# Patient Record
Sex: Male | Born: 1970 | Race: White | Hispanic: No | Marital: Married | Smoking: Never smoker
Health system: Southern US, Academic
[De-identification: ages and names within clinical notes are randomized; demographics above are authoritative.]

---

## 2005-06-29 ENCOUNTER — Other Ambulatory Visit (HOSPITAL_COMMUNITY): Payer: Self-pay

## 2014-06-30 ENCOUNTER — Encounter (HOSPITAL_COMMUNITY): Payer: Self-pay

## 2014-06-30 NOTE — Ancillary Notes (Addendum)
[facility] Record for: Samuel Tapia, Samuel Tapia   Created: 06/29/2014 12:16:30 PM  MRN: [id number]  DOB: May 25, 1971  SSN: [id number]  Sex: Male  Height: 5 Feet 8 Inches - Weight: 189  Maiden Name:   Address:   [address]  CityStZip: [place], [place] [address]  E-Mail:   Day Phone: [phone] Night Phone:  Other Phone:   Phone comments:   Call Back time:   Authorized contact: WIFE-Kimberly Karrie Meres  Intake Date: 06/29/2014 12:16:30 PM  PCP: Reggy Eye, Ryan   RefMD: Self , Referral   Primary Insurance: Workers' Comp  Secondary Insurance:   Insurance Comments: WE NEED A SINGLE CASE AGREEMENT/ctAuthorization: [facility] Initial Evaluation - within 60 days of July 02, 2014 filed copy in [place] folder/cc     -------Referral Assignment------  Where was the original referral directed?: Spine Center  Was a specific reviewer requested?: Unassigned Referral  How was the reviewer selected?: Rotation  Who requested the specific reviewer?:   How did you hear of our spine program?:   Is this a second opinion?:      -------Merchandiser, retail----------  Favorite Color:   City of Birth:      ---------- 1st Review ----------     Review Date: 07/23/2014  Review Completed by: John Guinea-Bissau  Impression: Herniated Lumbar Disc  Disposition: Appointment with Me, Other Treatment or Testing  Appt w/ Colleague:   Colleague Name:   Appt How Soon:   Pre Treatment Type: X-Rays; ESI  Pre Treatment Type Details: X-Ray Type: A/P Lateral; Lumbar; Spot ESI Level: L5-S1 ESI Side: midline ESI Number of Injections: 1 ESI Follow-Up: Reviewing Provider   Pre Treatment Other Test:   Appt Type: First Available Appointment  Instructions: LBP and right leg numbness.MRI from 2014 with large L5-S1 right sided HNP.  Updated MRI now with small residual disc material.  Sounds like a S1 radiculopathy.Get a L5-S1 ESI and I can see after injection.  Needs lumbar x-rays ap, lat, spot when seen.     ---------- Symptoms ----------     Chief  complaint: low back pain with back spasms --- more concerned with right leg numbness and  back weaknessno/hip/buttuck/leg/groin pain  Diagnosis from Other MD:      Symptoms: Pain,Numbness and/or tingling,Muscular weakness  Other Symptom Description:   Pain Location: Low back  Other Pain Location Description:   Where is your pain the worst?:   Pain Type: Sharp/Stabbing,Dull/Aching,Intermittent  Pain Rating: 5  Does the pain radiate to other parts of your body? No   Radiate Where:   Other Description:   Does it radiate to the fingers?   Does it radiate below the elbow?   Which specific part of your arm?   Which fingers?   Which part of your arm does pain go to?   How does it radiate to the arm?   Does it radiate to the toes?   Does it radiate below the knee?   Which specific part of your leg?   Which toes?   Which part of your leg does pain go to?   How does it radiate to the leg?   Additional pain information      Location of Numbness/tingling: Leg,Toes  Other Description:  Does numbness/tingling radiate to other parts of your body?:  Where does the numbess/tingling radiate?:  Other Description:  Does the numbness/tingling radiate below your elbow?:  Which specific part of your arm?:  Which fingers:  Which pat of your arm  does the numbness/tingling go to?:  How does the numbness/tingling radiate to your arm?:  Does the numbness/tingling radiate below your knee?:  Which specific part of your leg?:  Which toes:  Which part of your leg does the numbness/tingling go to?:  How does the numbness/tingling radiate to your leg?:  Additional Numbness/tingling information: Tingling and numbness in right calf to ball of right foot and all right toes  Other Description:      Location of Weakness: Low back  Other Description:   Additional Weakness Information:      The symptoms have been present for: more than 1 year  The symptoms began:   Was there a specific event that caused your symptoms?: As a result of an injury at  work  Additional Narrative Description: 02/2013   --- re-injured 6/15  Are you able to perform your daily activities with these symptoms?:   Since what date have you been unable to perform your daily routine?:   The symptoms improve when you: Walk  Other activities that improve your symptoms:   The symptoms worsen when you: Stand,Sit,Activity  Other activities that worsen your symptoms:      ---------- Work History ----------     Are you able to work?: Yes  Reasons for not working:   Other Reason for not working:   Do you have Work Restrictions:   If applicable, maximum lifting restriction:   How long have you been unable to work:   Have you ever filed a W/C claim related to a neck or back injury?: yes  Occupation: Physical labor  Other Occupation Information: Engineer, civil (consulting)     ---------- Bowel/Bladder/Incontinence Issues ----------     Since the onset of symptoms, have you experienced any new problems urinating or having bowel movements?: No  Description:   Other Description:   How long have you had these bowel/bladder problems?:      ---------- Allergies ----------     Do you have any medication allergies? No  Allergies:   Other Details:   Allergic to Latex?: No  Allergic to intravenous contrast dyes?: No  Allergic to steroids?: No     ---------- Treatment/Testing ----------      Taking prescription medication for this problem?: Yes  Are you using any now?: Yes  Medication: Robaxin; MD Name: Dr. Renold Don; Date Prescribed: 2014; Dosage: ; Times/Day: 2- 3 x day  Medication: Ibuprofen; MD Name: Dr. Renold Don; Date Prescribed: 2014; Dosage: ; Times/Day: 2 - 3 x day  Have you received a Medrol dose pack for this problem?: No  When did you last take the dosepack?:   Were your symptoms improved?:   Would you describe your relief as:   How long did you experience that amount of relief?:   Other Medrol dosepack information:      --------------- PT ---------------     PT: Yes  When Received: 6/15  [facility] Received[facility]  Other where received information:   Visits: 3 x weeks for 6 months  Types:   Other Types: core streghtening  Improved: Yes     If improved, describe level of relief: Large  If improved, how long did you experience relief: Long term  Other Physical Therapy Treatment Information:      ---------- Chiropractic Services ----------     Chiro: Yes  When: 2014  Who: Bland Span  Visits: 5 x day 6 weeks  Types: Manipulation,Modalities - Ultrasound/ E-stim  Other Types:   Were Symptoms Improved: Yes  If improved, describe level of relief: Small  If improved, how long did you experience relief: Short term  Other Chiropractic Treatment Information:      --------------- ESI ---------------     ESI: No  When:   Who:   Visits:   Types:   Improved:   If improved, describe level of relief:   If improved, how long did you experience relief:   Other Injection Treatment Information:      ---------- Diagnostic Tests ----------     Plain spine X-rays [id number] at Review Where:   Area(s) Scanned: Lumbar  MRI scan On:03-17-13 Where: [facility]  Area Scanned: Lumbar  MRI Lumbar On:07/08/14 Where: [facility]   Do you have any of the following in case an MRI is ordered?: None  Other MRI factors:      ---------- Past Medical History ----------     What other doctors/providers have treated you for these spine issues? Dr. Noreene Filbert Specialty: Neurosurgery Date: 2014    Ever diagnosed with Spine deformity?:   Prior neck or back surgery (1)?: No  When:   Who:   Area of neck/spine operated on:   Level:   Were symptoms improved?:   If improved, describe level of relief:  If improved, how long did you experience relief?:   Prior neck or back surgery (2)?:   When:   Who:   Area of neck/spine operated on:   Level:   Were symptoms improved?:   If improved, describe level of relief:  If improved, how long did you experience relief?:   Prior neck or back surgery (3)?:   When:   Who:   Area of neck/spine operated on:    Level:   Were symptoms improved?:   If improved, describe level of relief:  If improved, how long did you experience relief?:   Do you have any of the following assistive devices? None  How long have you required these assistive devices?   Currently being treated for any other medical condition?: Yes  Conditions: Hypercholesterolemia  Other Conditions:   Type of neurologic disorder:   Cancer Type:   Other Treatment/Medication: Pravastatin  What blood thinners are you currently taking?: None  Which physicians are treating you for your other medical conditions?: Dr. Renold Don  Do you smoke?: No  Are you pre-menopausal or post-menopausal?:   If recommended, are you willing to consider surgery?:   What is your goal in seeking treatment?:   Other pertinent information/ general comments/ goals for treatment: skin cancer-2014no other studies     ---------- Care Coordinator Information ----------     Care Coordinator: RS  Activity Log: 07/23/2014 10:33:27 AM Misty Stanley Herod]: Phone call to patient. Reviewed patient's medical history.  Patient states no change in symptoms.  Discussed MD initial impression and recommendations of ESI L5-S1 midline single injection and first available with Dr. Guinea-Bissau where he will get x-rays.  Provided education on LBP and right leg numbness. MRI from 2014 with large L5-S1 right sided HNP.  Updated MRI now with small residual disc material. Sounds like a S1 radiculopathy. Patient chose to accept recommendations. Instructed to follow-up with PCP/referring MD for therapy script due to insurance restrictions.  Instructed Referral Specialist will call to schedule appointment/tests. Explained that a letter will be faxed to the PCP/referring physician regarding this conversation. Patient verbalized understanding. Marcelina Morel, RN9/21/2015 1:49:37 PM [Lori Mueller]: Received call from patient stating "My cousin developed arachnoiditis from an epidural shot and I'm not comfortable getting  the injection just  yet, I'm afraid it might be congenital".  Offered first available with Dr. Guinea-Bissau and he agreed. LMueller RN CRRN     Letter/Test Coordinator: Letters  Letter/Test Coordinator Log: 07/23/2014 11:35:40 AM Elnita Maxwell Chidester]: Faxed treatment letter patient summary and requisition to Dr. Mattie Marlin. CChidester 07/27/2014 8:47:30 AM Rosey Bath Benson]: Signed requisition received. TBenson 07/27/2014 11:17:46 AM [Crystal Tephabock]: I sent an email to Nicholaus Corolla to check insurance. Ctephabock 07/27/2014 1:41:28 PM [Crystal Tephabock]: WE NEED A SINGLE CASE AGREEMENT. I called and left a voicemail for the case worker to call us to get the injection and appointment set with the singlecase agreement. I called and updated the pt. Ctephabock 07/27/2014 1:57:44 PM [Crystal Tephabock]: Pt doesn't want an injection but still needs the singlecase agreement to see Dr. Guinea-Bissau. Ctephabock 08/03/2014 10:08:31 AM [Crystal Tephabock]: Pt called in and I told him we are still waiting for the case worker to call in to do the single case agreement. He will call his case worker now. Ctephabock 08/03/2014 2:41:33 PM Rosey Bath Benson]: Patient called and they are still working on single case agreement per his case worker. TBenson 08/03/2014 4:26:00 PM [Leslie Crossley]:  Authorization received for spine center appointment.  Carroll Kinds RN 08/04/2014 10:13:54 AM Rosey Bath Benson]: Called and spoke to patient and explained that the authorization we received is not a single case agreement and he said he did talk to his case worker yesterday and they are working on single case agreement.  TBenson 08/05/2014 9:10:38 AM Verlon Au Crossley]: Authorization for Initial evaluation received from [facility].  Carroll Kinds RN 08/05/2014 10:41:50 AM Aggie Cosier Tephabock]: Authorization received is not a single case agreement. Ctephabock 08/05/2014 12:44:06 PM [Crystal Tephabock]: Pt's employer called to get information about the singlecase agreement. I explained  that I couldn't give his employer any information and the I would call the pt and speak with him. I called and left a voicemail for the pt to call back. Ctephabock 08/05/2014 1:45:59 PM [Crystal Tephabock]: Pt called and he was wanting to know when we received his authorization letter. I told him and explained again that we haven't received the singlecase agreement. I explained what it is and he is working with his HR person at the work to get this taken care of. I also gave him [facility] name and phone number. Ctephabock 08/12/2014 10:39:03 AM [Crystal Tephabock]: Pt called and said his case worker told him to call us that he had the single case agreement set up. I called and left a voicemail for Delight Stare to call back to check on this. Ctephabock 08/13/2014 11:24:34 AM [Crystal Tephabock]: Singlecase agreement has been received per Tammy Boyce---effective dates 08/06/14-02/04/15. I called and left a voicemail for the pt to call back to schedule. Ctephabock 08/13/2014 1:13:28 PM Rosey Bath Benson]: Patient returned call and is scheduled to see Dr. Guinea-Bissau on 10/13/14 at 12:15pm. TBenson     Intake Specialist Comments: 06/30/2014 12:18:46 PM Elnita Maxwell Chidester]: Spoke with patient.  He is eating his lunch. Ask if we could call him in about one hour. Mailed postcard also. CChidester.     Clinic Staff Comments:      Last Edited by: Knute Neu on 08/13/2014 1:15:35 PM  Last Review by: John Guinea-Bissau on 07/23/2014

## 2014-10-13 ENCOUNTER — Other Ambulatory Visit (INDEPENDENT_AMBULATORY_CARE_PROVIDER_SITE_OTHER): Payer: Self-pay | Admitting: Orthopaedic Surgery of the Spine

## 2014-10-13 ENCOUNTER — Encounter (INDEPENDENT_AMBULATORY_CARE_PROVIDER_SITE_OTHER): Payer: Self-pay | Admitting: Orthopaedic Surgery of the Spine

## 2014-10-13 ENCOUNTER — Ambulatory Visit: Payer: Worker's Compensation | Attending: Orthopaedic Surgery of the Spine | Admitting: Orthopaedic Surgery of the Spine

## 2014-10-13 ENCOUNTER — Ambulatory Visit (HOSPITAL_BASED_OUTPATIENT_CLINIC_OR_DEPARTMENT_OTHER): Payer: Worker's Compensation

## 2014-10-13 VITALS — BP 141/87 | HR 83 | Temp 98.1°F | Ht 69.6 in | Wt 197.3 lb

## 2014-10-13 DIAGNOSIS — R29890 Loss of height: Secondary | ICD-10-CM

## 2014-10-13 DIAGNOSIS — M4316 Spondylolisthesis, lumbar region: Secondary | ICD-10-CM

## 2014-10-13 DIAGNOSIS — M549 Dorsalgia, unspecified: Secondary | ICD-10-CM

## 2014-10-13 DIAGNOSIS — M5117 Intervertebral disc disorders with radiculopathy, lumbosacral region: Secondary | ICD-10-CM | POA: Insufficient documentation

## 2014-10-13 DIAGNOSIS — M545 Low back pain, unspecified: Secondary | ICD-10-CM

## 2014-10-14 NOTE — H&P (Addendum)
[facility]                              DEPARTMENT OF [place], [place] [address]                                PATIENT NAME: Samuel Tapia, Samuel Tapia  HOSPITAL UEAVWU:[id number]  DATE OF SERVICE:10/13/2014  DATE OF BIRTH: 1971/04/21    HISTORY AND PHYSICAL    CHIEF COMPLAINT:  Lower back pain and bilateral lower extremity radiculopathy symptoms.    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  He developed back pain soon after finishing his shift.  He was diagnosed with an L5-S1 broad-based HNP and completed a 13-month course of physical therapy and returned to work after about 6 months.  He did describe right lower extremity radicular symptoms at the time of this injury.  He was in his normal post-injury status up until June 2015, when he completed a 2-hour flight in which he says that he felt quite cramped.  He denies any specific injury but states that the following morning when he awakened from his chest he had significant lower back pain and radicular symptoms in his left lower extremity that started the side of his hip and proceeded distally to his thigh.  He describes the sensation as a burning feeling.  It never progresses past the level of his knee.  He also endorses having a 1-cm leg length discrepancy with his right being shorter than his left.  He states that he has essentially L5 radiculopathy symptoms that go from his buttock to his leg and into his great toe.  This is all numbness, and there is no pain.  The patient also feels that if his right calf is weaker than his left.  Sitting, standing and motion tend to aggravate his symptoms.  He has tried physical therapy,  ibuprofen and Robaxin which he has found helpful.  He has also visited a chiropractor on several occasions which has also been somewhat helpful.  He denies any red flag symptoms to include bowel or bladder incontinence, saddle anesthesia or any myelopathic symptoms.    REVIEW OF SYSTEMS:  A comprehensive review of systems was performed at today's visit and notable for numbness, tingling and balance problems in the right lower extremity, as well as seasonal allergies.    PAST MEDICAL HISTORY:  Significant for skin cancer, unspecified, treatment complete, yearly checks, as well as hyperlipidemia and Bell palsy.    PAST SURGICAL HISTORY:  Significant for multiple lipoma removals in August 2015 in [place], [place].    MEDICATIONS:  1.  Ibuprofen 800 mg b.i.d. p.r.n.  2.  Robaxin 750 mg daily p.r.n.  3.  Pravastatin 40 mg nightly.  4.  Claritin 1 tab nightly p.r.n.    ALLERGIES:  NKDA.    SOCIAL HISTORY:  The patient is employed as a Soil scientist, who currently works Marine scientist.  His last non-light duty shift was on May 03, 2014.  He enjoys hunting, movies and skiing prior to this injury.  He does not smoke, chew or rub tobacco.  He occasionally drinks alcoholic beverages.  This injury is being covered by workers' compensation.  There are no disability claims or social security benefits pending.  There are no legal transactions in progress.    FAMILY HISTORY:  Significant for father with a back injury related to work and the father with high blood pressure, skin cancer, seizures and high cholesterol, and a mother with hypertension and multiple sclerosis.    PHYSICAL EXAMINATION:  Vitals at today's visit:  BP 141/87, pulse 83, temperature 98.1 degrees Fahrenheit, weight 89.5 kg, height 5 feet 9-1/2 inches tall.    General:  The patient is in no acute distress.  He is accompanied by his wife in the exam room.  Psychiatric:  Mood and affect congruent with clinical situation.  Neuro:  Facies symmetric.  Skin:  No  lesions, rashes or excoriations noted.  HEENT:  Head is atraumatic, normocephalic.  Respiratory:  Nonlabored breathing.  Cardiovascular:  Regular rate.    Abdomen:  Nondistended.    Spine:  Observation of the patient's gait reveals a nonantalgic limp.  He is able to walk on toes, heels and tandem with no balance or strength issues.  The patient is able to perform at least 10 heel raises on each lower extremity without any issue.  The patient does not have any tenderness to palpation over his midline lumbar spine.  He has 5/5 strength to his bilateral upper and lower extremities.  Sensation is intact to light touch throughout the C5 through T1 and L3 through S1 distributions.  He does endorse paresthesias to the left lateral thigh from approximately the buttock level down to the knee.  His reflexes are 1/4 and symmetric to the bilateral upper and lower extremities.  Hoffmann is negative.  There is no clonus.  Babinski is downgoing.  Straight leg raise is negative bilaterally.    IMAGING:  X-rays of the patient's lumbar spine were taken and reviewed at today's visit and notable for slightly decreased disk height at the L5-S1 level.  An MRI of the patient's lumbar spine from May 2014 was reviewed and notable for a broad-based disk bulge at the L5-S1 level.  A new MRI from July 08, 2014, was also reviewed on [facility] and notable for significant resolution of the broad-based disk bulge at L5-S1 with the right foramen slightly narrower than the left.  There is also some mild bilateral foraminal stenosis at the L4-L5 level.    ASSESSMENT:  Lumbar back pain, resolving; L5-S1 herniated nucleus pulposus (HNP) and bilateral lower extremity radicular symptoms.    PLAN:  At this point in time, the patient's complaints seem to be mostly stemming from his back pain rather than his leg issues.  We discussed with him the fact that back pain is much more difficult to treat than leg pain.  In fact, we would be hesitant to even  offer an epidural injection at this time secondary to the minimal amount of leg discomfort the patient is experiencing.  While we do not necessarily advocate for surgery at this time, surgical interventions were discussed with the patient in full detail.  Specifically, we discussed an L5-S1 anterior lumbar interbody fusion (ALIF) with posterior percutaneous screws.  The risks and benefits of the procedure were detailed out for the patient for his consideration.  We would continue  to recommend activities as tolerated, physical therapy and conservative treatment.  The patient can continue seeing a chiropractor if he chooses.  It was basically up to him to decide how much of his quality of life is impacted by this injury.  He is okay to return to work when he sees fit.  We would be happy to see him again on a p.r.n. basis.  If he has any questions or concerns, he is welcome to contact our office.      Juleen ChinaValerie Nicole Brice, MD  Resident  [facility]    Samoria Fedorko Guinea-BissauFrance, MD  Chief, Section of Spinal Surgery  [facility]    [id number]; D: 10/13/2014 15:27:19; T: 10/14/2014 09:46:18           I personally saw and examined this patient, reviewed their studies, the resident  or PA note, and dicussed the findings and treatment options with the patient and resident or PA.      Jaelen Gellerman Guinea-BissauFrance, MD 10/14/2014, 16:59

## 2021-03-17 IMAGING — MR MRI JOINT LOWER EXTREMITY WITHOUT CONTRAST LT
4 of 6 series · 23 of 40 positions shown · IV contrast (gadolinium)
Comparison: Radiographs dated 12/11/2019.

EXAM:  MRI JOINT LOWER EXTREMITY WITHOUT CONTRAST LT
INDICATION: Left knee pain, no recent injury.
TECHNIQUE: Multiplanar multisequential MRI of the left knee joint was performed without gadolinium contrast.

[Series 5: PD fat-sat · axial · left · 4.0mm · 0.37mm/px · z∈[-111,+20]mm · 7 of 30 slices shown (1 of 4)]
[im 1/30]
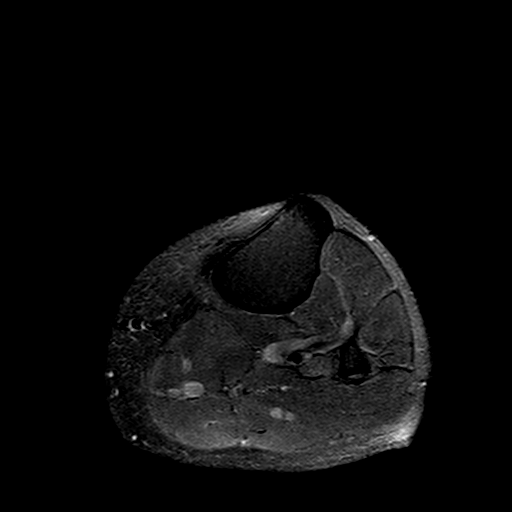
[im 5/30]
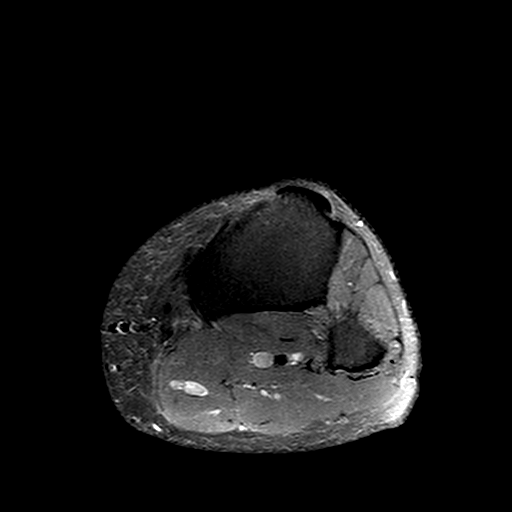
[im 10/30]
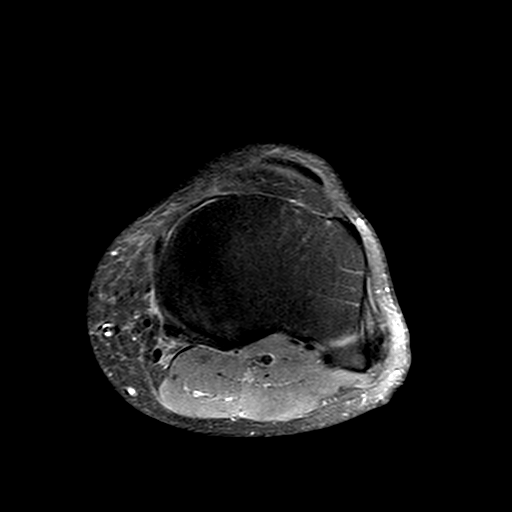
[im 15/30]
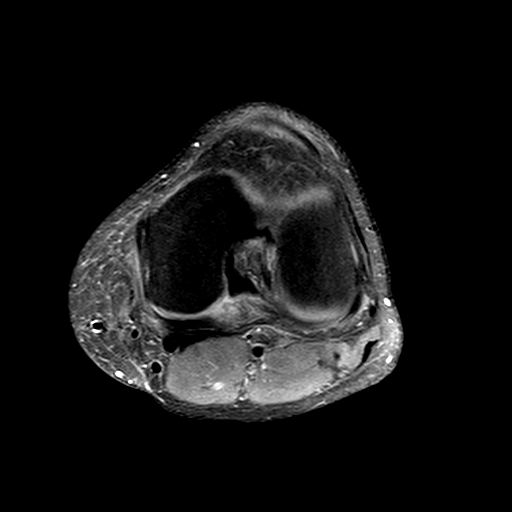
[im 20/30]
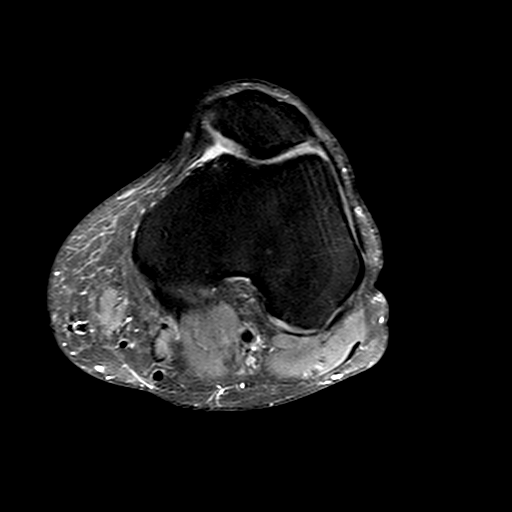
[im 25/30]
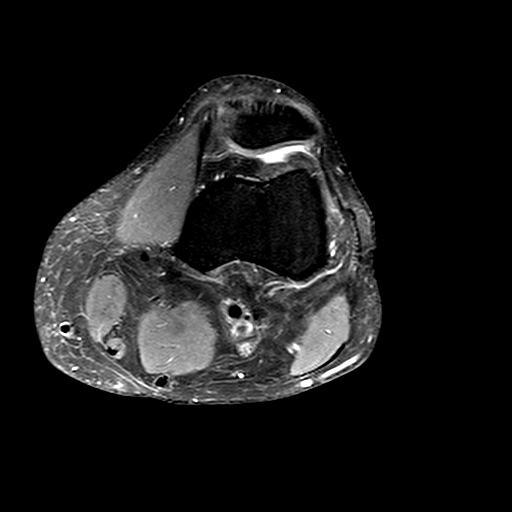
[im 30/30]
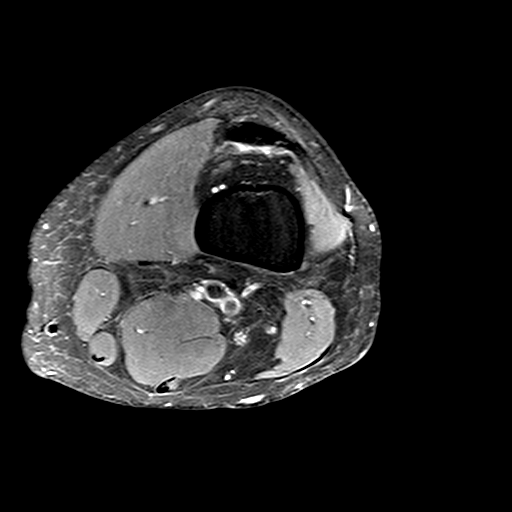

[Series 6: PD fat-sat · sagittal · left · 3.0mm · 0.29mm/px · 7 of 30 slices shown (2 of 4)]
[im 1/30]
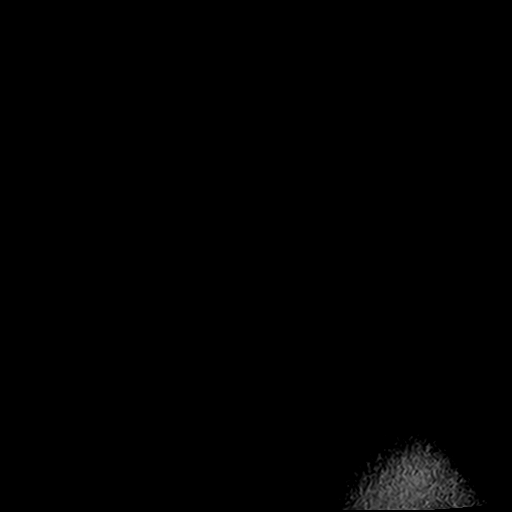
[im 5/30]
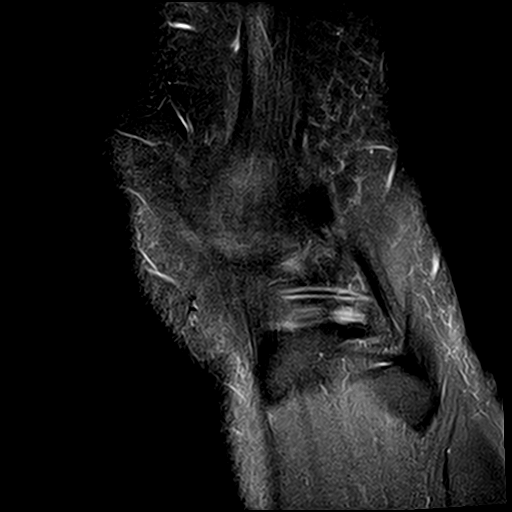
[im 10/30]
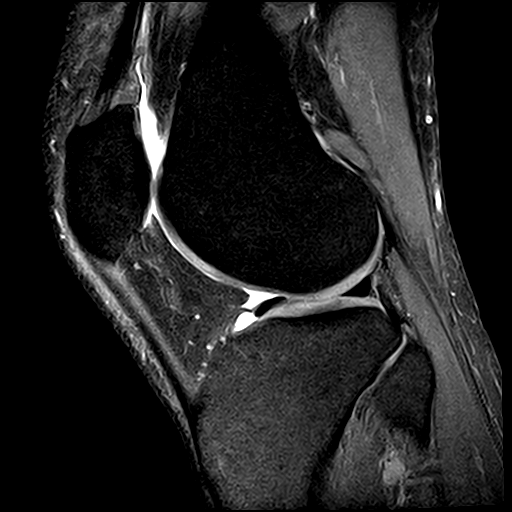
[im 15/30]
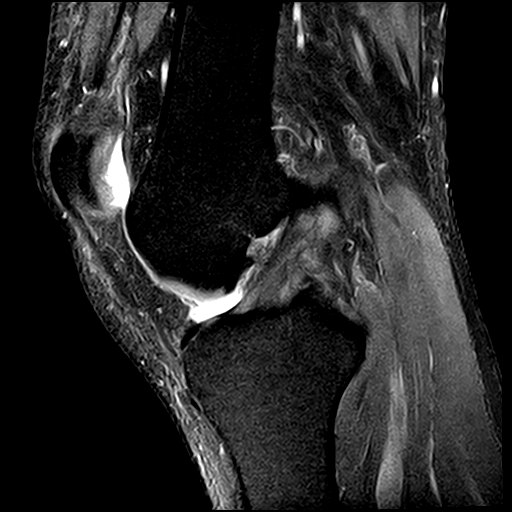
[im 20/30]
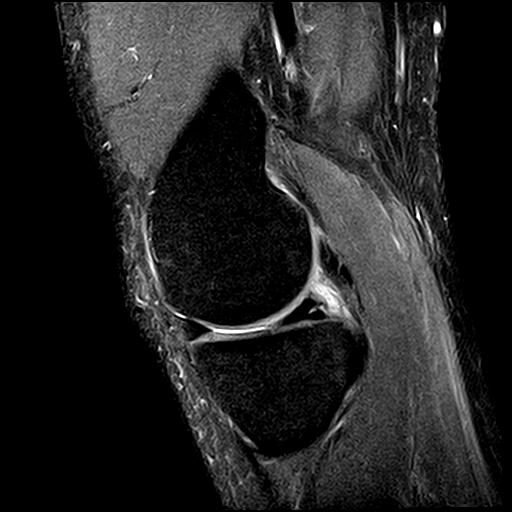
[im 25/30]
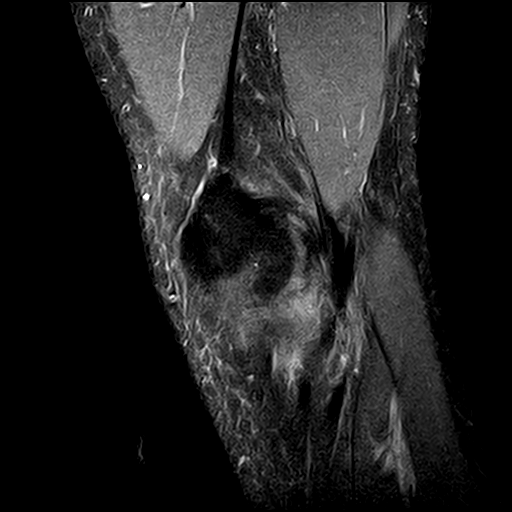
[im 30/30]
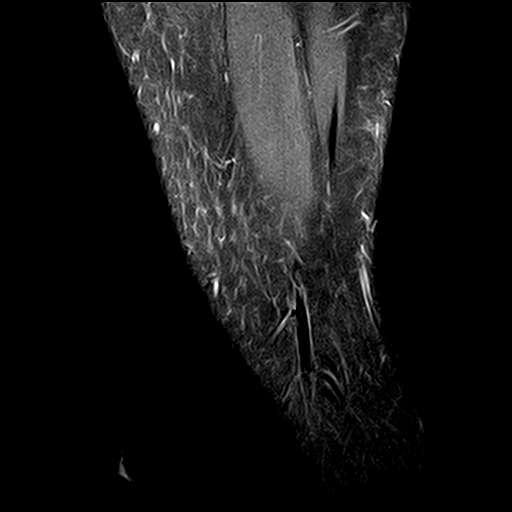

[Series 12: PD fat-sat · coronal · left · 3.0mm · 0.33mm/px · 6 of 27 slices shown (3 of 4)]
[im 1/27]
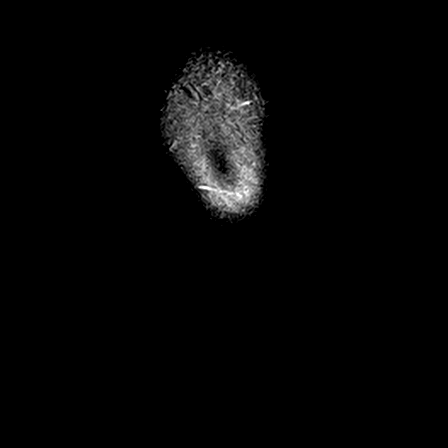
[im 6/27]
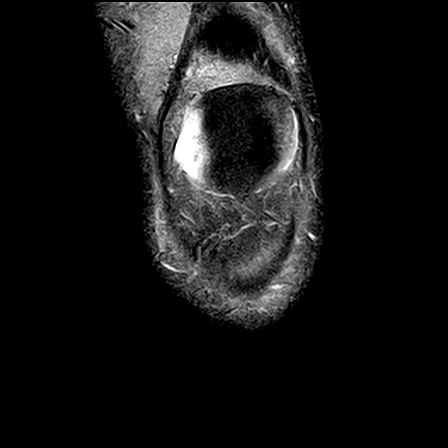
[im 11/27]
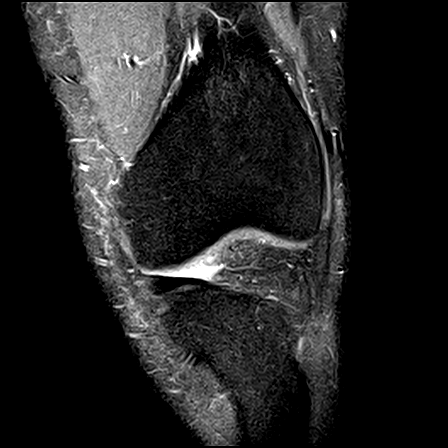
[im 16/27]
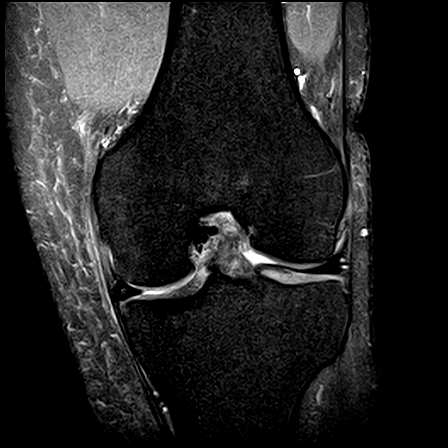
[im 21/27]
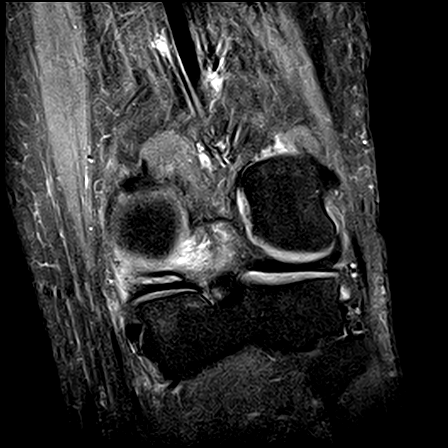
[im 27/27]
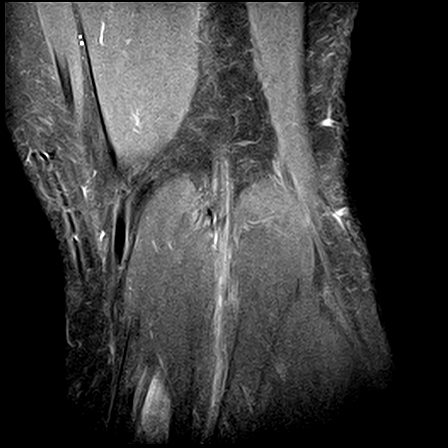

[Series 14: PD fat-sat · axial · left · 4.0mm · 0.59mm/px · z∈[-93,-3]mm · 3 of 30 slices shown (4 of 4)]
[im 5/30]
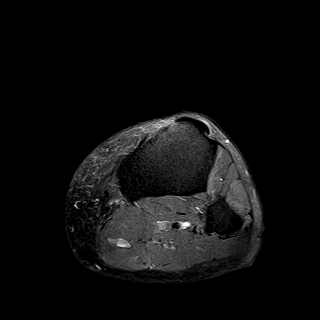
[im 15/30]
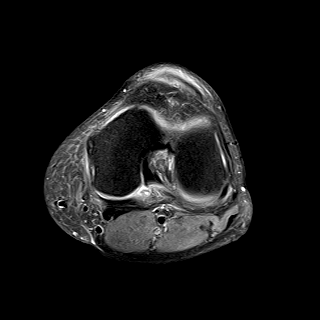
[im 25/30]
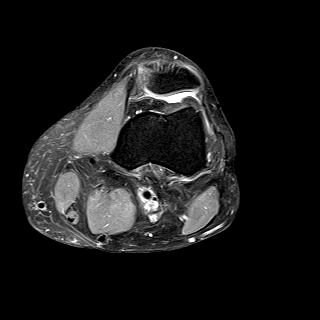

[23 of 40 positions shown; findings below may reference images not displayed]

FINDINGS: There is a complex tear involving the body and posterior horn of the medial meniscus. Lateral meniscus, cruciate and collateral ligaments are intact, within normal limits in morphology and signal intensity. Hyaline cartilage of the tibiofemoral and patellofemoral articulations is well maintained. Extensor mechanism is intact. Capsular attachments appear unremarkable. Bone marrow signal intensity is normal. There is no suprapatellar effusion. There is no Baker's cyst.
IMPRESSION: Complex tear involving the body and posterior horn of the medial meniscus.

## 2022-03-21 ENCOUNTER — Other Ambulatory Visit (HOSPITAL_COMMUNITY): Payer: Self-pay | Admitting: FAMILY PRACTICE

## 2022-03-21 DIAGNOSIS — R1310 Dysphagia, unspecified: Secondary | ICD-10-CM

## 2022-04-28 ENCOUNTER — Inpatient Hospital Stay
Admission: RE | Admit: 2022-04-28 | Discharge: 2022-04-28 | Disposition: A | Discharge status: Home / Self Care | Source: Ambulatory Visit | Attending: FAMILY PRACTICE | Admitting: FAMILY PRACTICE

## 2022-04-28 ENCOUNTER — Other Ambulatory Visit: Payer: Self-pay

## 2022-04-28 DIAGNOSIS — K219 Gastro-esophageal reflux disease without esophagitis: Secondary | ICD-10-CM

## 2022-04-28 DIAGNOSIS — R1310 Dysphagia, unspecified: Secondary | ICD-10-CM | POA: Insufficient documentation

## 2022-04-28 MED ORDER — BARIUM SULFATE 96 % (W/W) ORAL POWDER FOR SUSPENSION
2.00 g | INHALATION_SUSPENSION | ORAL | Status: AC
Start: 2022-04-28 — End: 2022-04-28
  Administered 2022-04-28: 2 g via ORAL

## 2022-04-28 MED ORDER — BARIUM SULFATE 60 % ORAL CREAM
2.00 g | TOPICAL_CREAM | ORAL | Status: AC
Start: 2022-04-28 — End: 2022-04-28
  Administered 2022-04-28: 2 g via ORAL

## 2022-04-28 NOTE — Speech Evaluation (Signed)
[facility]  [address]  [place], [address]  [phone]  (Fax) [phone]  Rehabilitation Services  Speech Therapy Modified [facility]) Outpatient        Date: 04/28/2022  Patient's Name: Samuel Tapia  Date of Birth: 1971-09-02  Reason for referral: Dysphagia, Unspecified  Referring Provider: Dr. Earlean Shawl    Samuel Tapia is a 51 y.o. male referred for MBSS due to symptoms of dysphagia. Patient reports that he has been getting choked easily on liquids, sometimes his own saliva, which provokes coughing. He denies dysphagia to solids or pills but states that mixed consistencies such as cereal with milk provoke coughing as well. He has had these symptoms for 10 years but states that it has been happening more frequently. He reports that his father gets strangled easily on liquids as well. Patient reports that he takes daily medication for GERD. He denies recent history of pneumonia or recent weight loss.    Pertinent History:   No past medical history on file.    Patient reports history of Bell's Palsy x3, the last occurrence being 15-20 years ago. He also reports history of GERD and hypothyroidism. He reports family history of Multiple Sclerosis. Patient denies history of neck or chest surgeries.                                                                         Subjective:   Alert:yes  Cooperative:yes  Follows Directions:yes  Dentition:Natural  Trach:no   Respiratory Status: Room air  Motor Status: WFL  Oral Motor Movements: Slight left-sided labial weakness noted  Current Diet Level: Regular solids and liquids      Objective:   Radiologist: Dr. Luciano Cutter  Radiographic View:Lateral  Position:Sitting  Contrast Consistencies:Thin Liquid, Level 2 Liquids: Mildly Thick (Nectar), Cracker and Applesauce  Total Number of Presentations: 8  Oral Phase:WFL, Bolus formation good, A-P Transit good and Mastication good  Pharyngeal Phase:WFL, No  Aspiration, Bolus propulsion strong and Hyoid to Mandible good  Epiglottic Inversion:yes   Esophageal Phase : Adequate UES function  Penetration- Aspiration Scale: 1-Material does not enter the airway.      Assessment:   Impressions: Normal swallowing    Oral phase judged to be within functional limits. Patient easily cleared traces of residual contrast from pharyngeal structures with a second swallow. No aspiration observed during study.    Plan:   Recommendations-Diet:Regular  Recommendations-Liquid:Level 0 Liquids: Regular/Thin  Aspiration Precautions:Independent, Small bites and Small sips  Treatment Strategies:Small bites, small sips; Avoid eating and drinking too quickly; GERD diet  Other Recommendations:None warranted  Results & Recommendations Discussed with:Patient  Comments: Patient provided with list of swallow strengthening exercises to practice at home. Patient states that he is aware of GERD diet modifications. Patient encouraged to contact our office if he has further questions or worsening dysphagia symptoms.        Therapist:     Verdia Kuba, SLP,04/28/2022,11:05    Total Session Time 30, Timed code minutes 0 and Untimed code minutes 30    Speech intervention minutes: SWALLOW EVALUATION 15 MINUTES and MBSS 15 MINUTES

## 2022-10-03 IMAGING — DX XRAY SHOULDER MINIMUM 2 VIEW RT
1 series · 2 of 2 positions shown · non-contrast
Comparison: None available.

﻿EXAM:  [DATE]      XRAY SHOULDER MINIMUM 2 VIEW RT,XRAY CERVICAL SPINE COMPLETE
INDICATION: Right shoulder pain with numbness and tingling radiating to both hands.

[Series 1: apobl · 0.14mm/px · 2 of 2 slices shown]
[im 1/2]
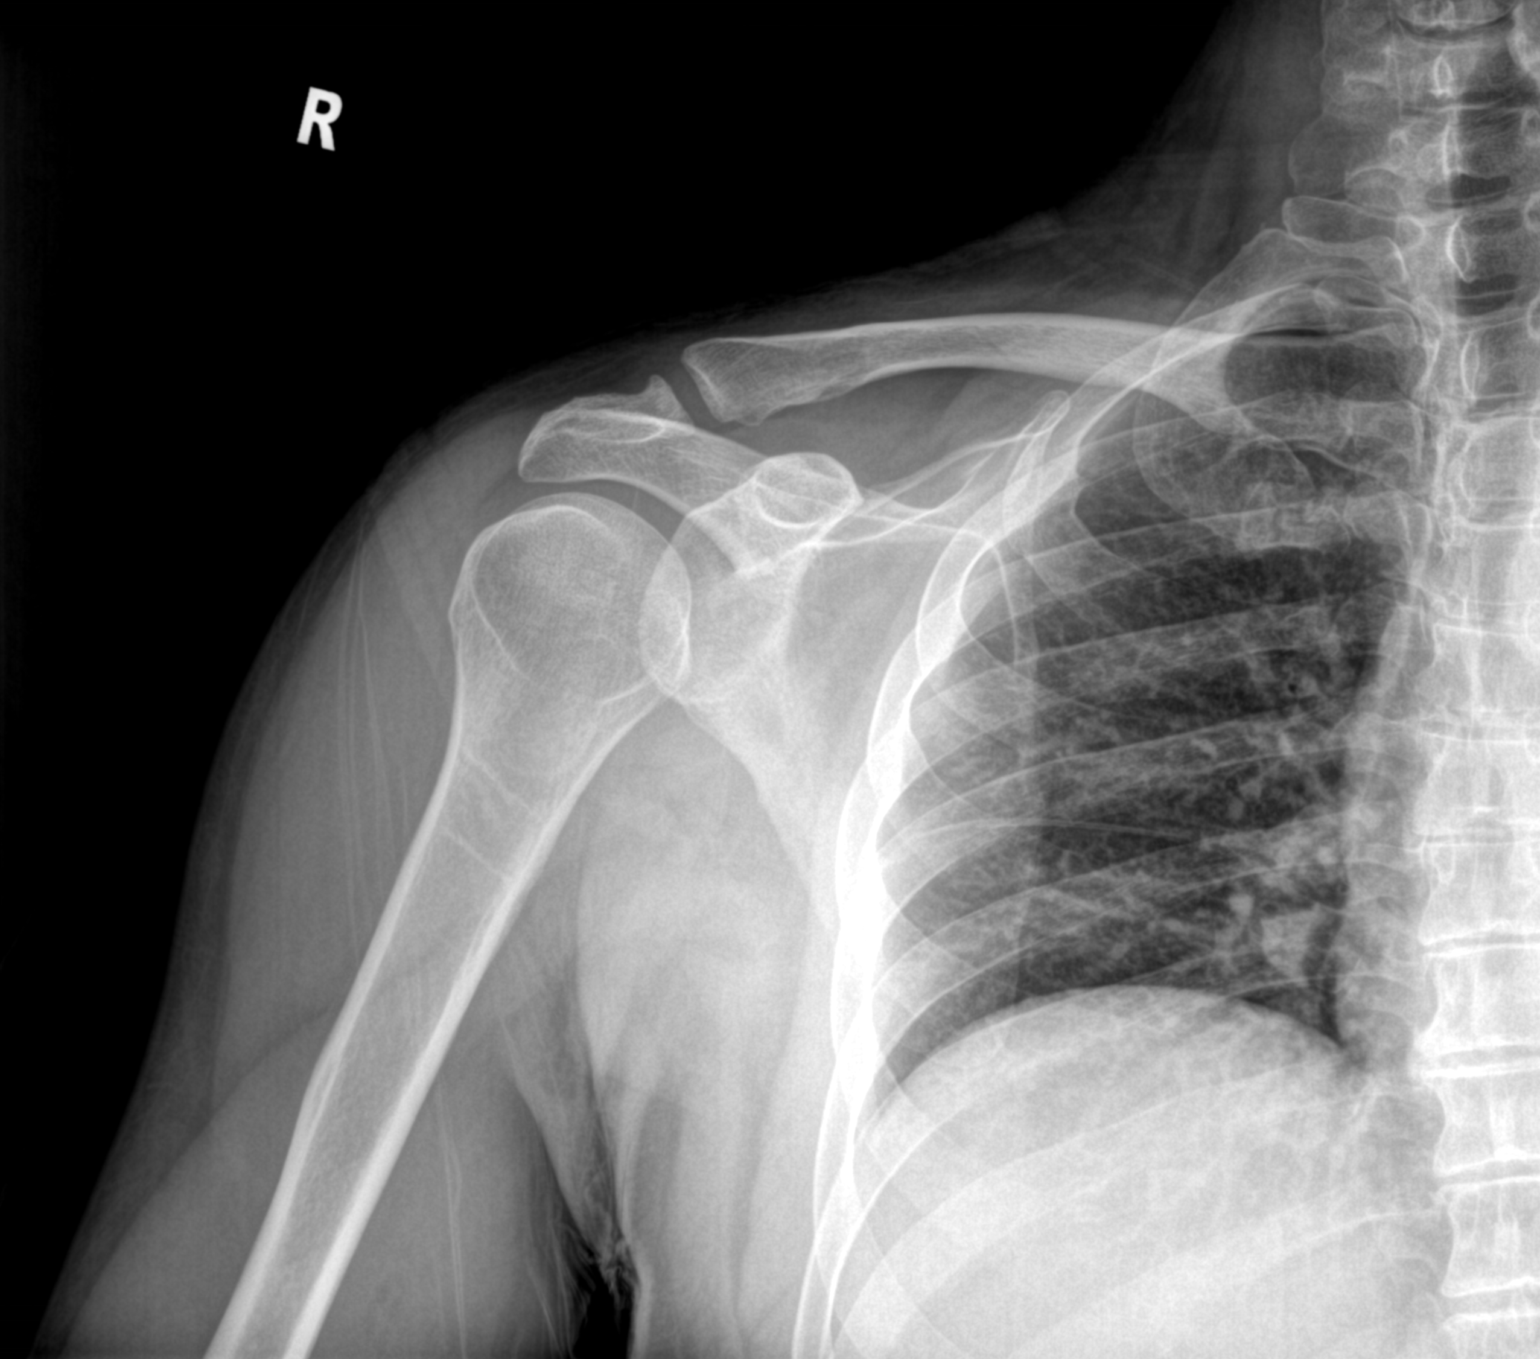
[im 2/2]
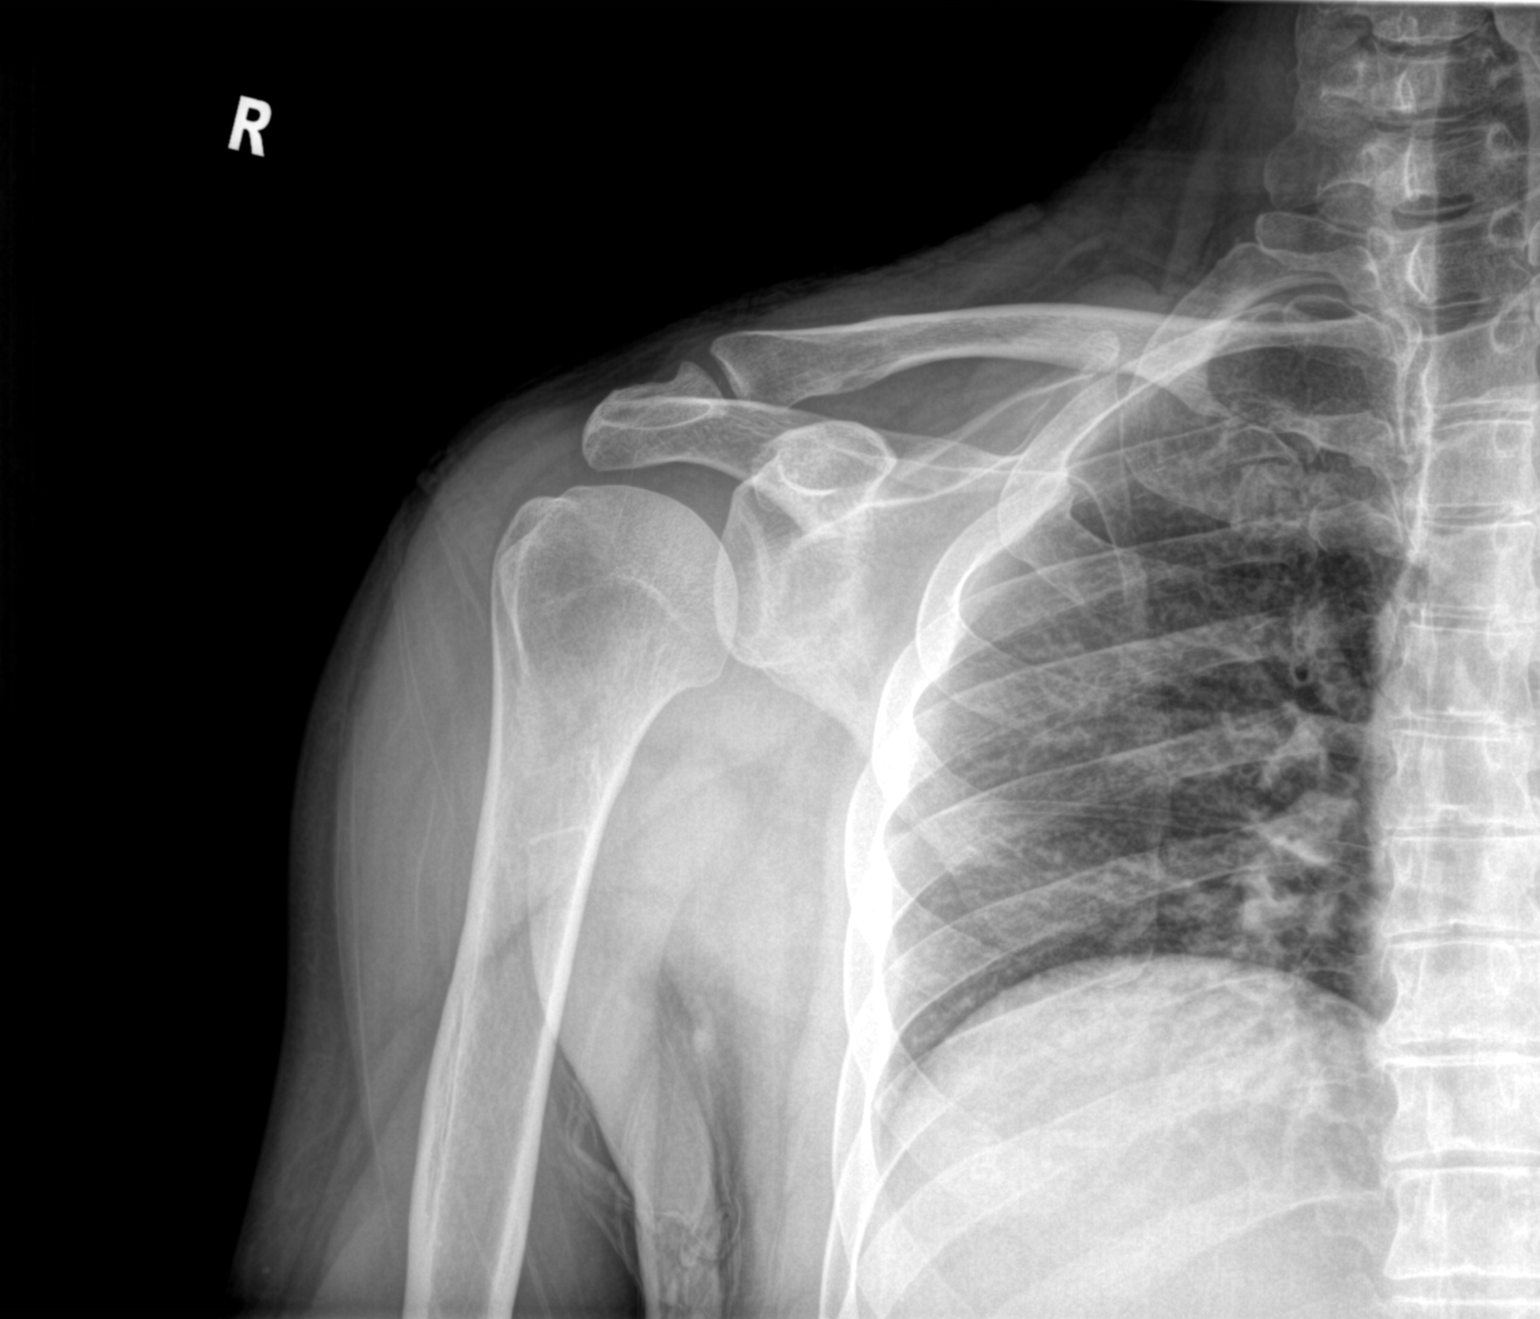

[2 of 2 positions shown; findings below may reference images not displayed]

FINDINGS: RIGHT SHOULDER:  Two views demonstrate no fracture, dislocation, significant degenerative change, lytic or blastic bony lesion, or soft tissue calcification.  

CERVICAL SPINE:  There are mild degenerative changes at multiple levels.  There is no fracture, malalignment, or lytic or blastic bony lesion.  There is mild narrowing of the left C5-C6 neural foramen.  The right neural foramina are suboptimally visualized due to suboptimal positioning, particularly in the lower cervical spine.
IMPRESSION: 1. Normal examination of the right shoulder.  

2. Mild degenerative changes of the cervical spine.

## 2022-10-03 IMAGING — DX XRAY CERVICAL SPINE COMPLETE
1 series · 6 of 6 positions shown · non-contrast
Comparison: None available.

﻿EXAM:  [DATE]      XRAY SHOULDER MINIMUM 2 VIEW RT,XRAY CERVICAL SPINE COMPLETE
INDICATION: Right shoulder pain with numbness and tingling radiating to both hands.

[Series 1: AP · 0.14mm/px · 6 of 6 slices shown]
[im 1/6]
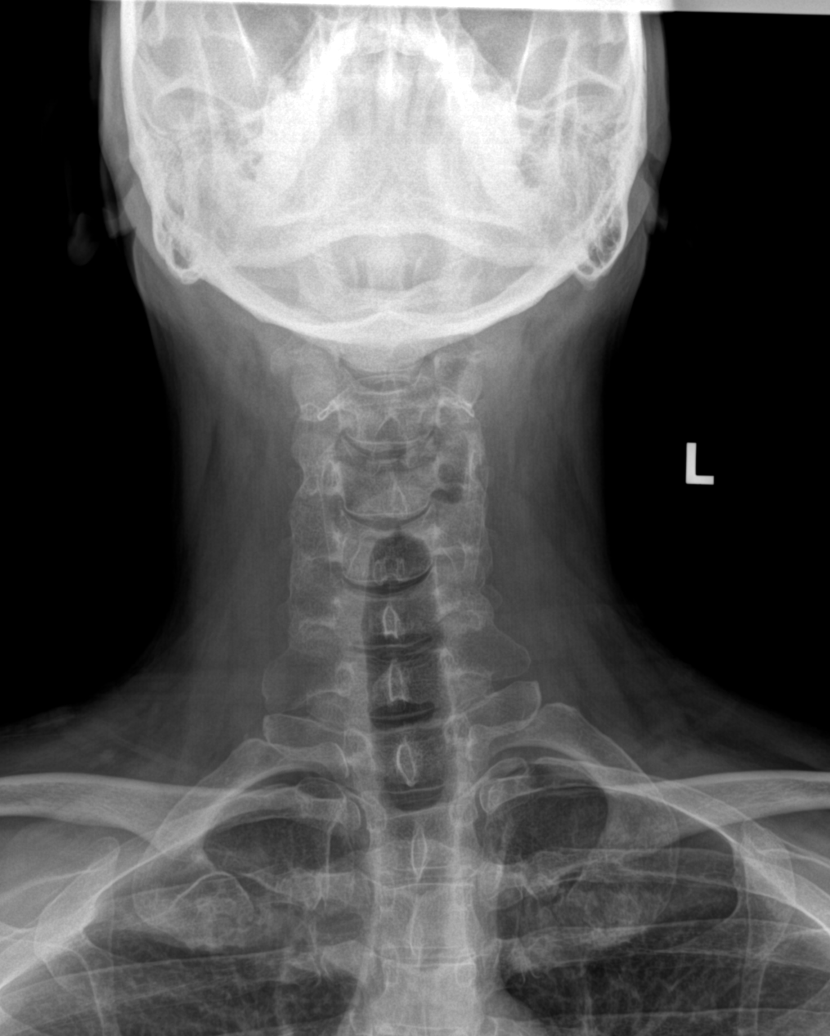
[im 2/6]
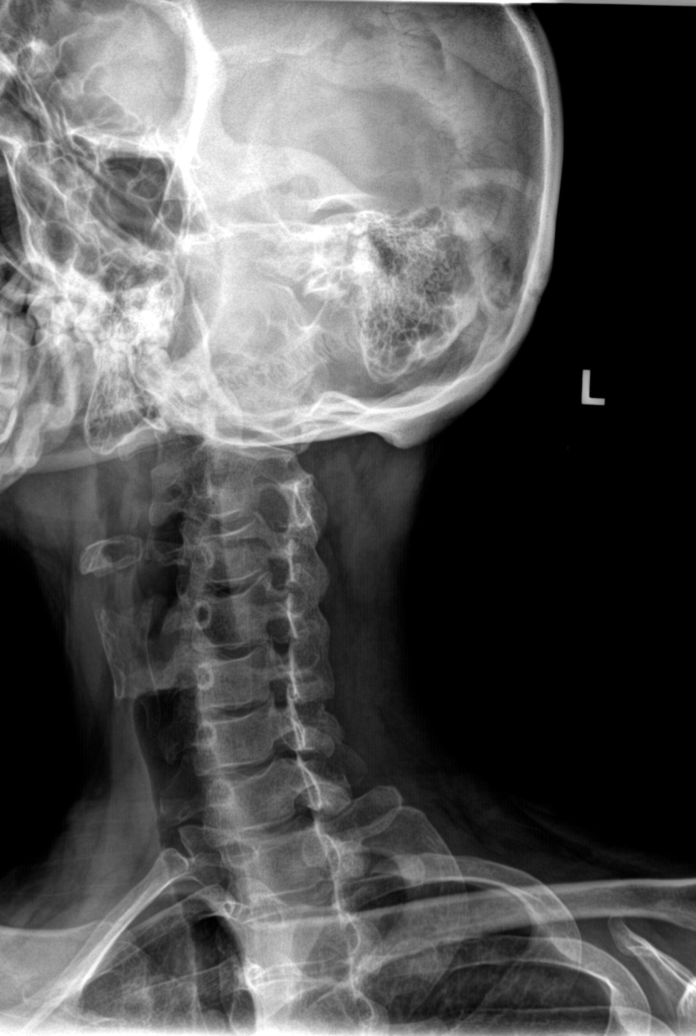
[im 3/6]
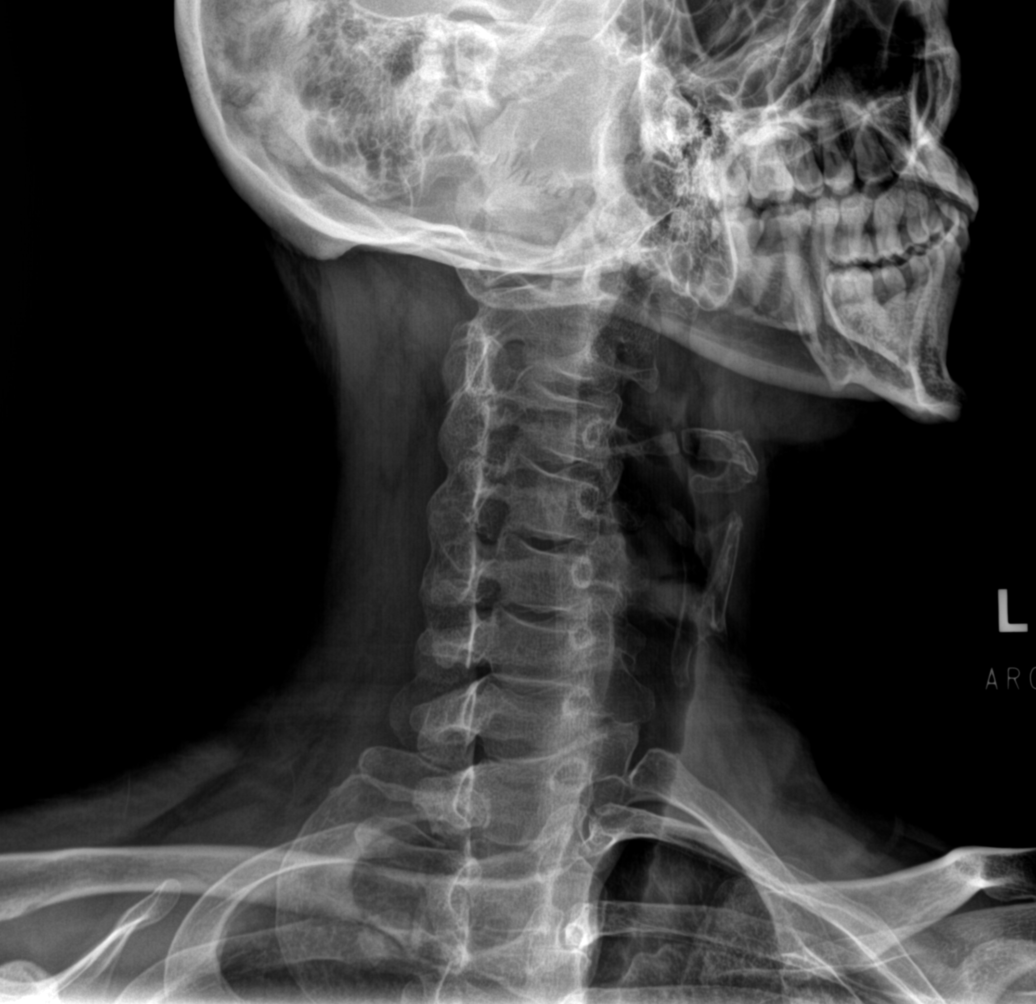
[im 4/6]
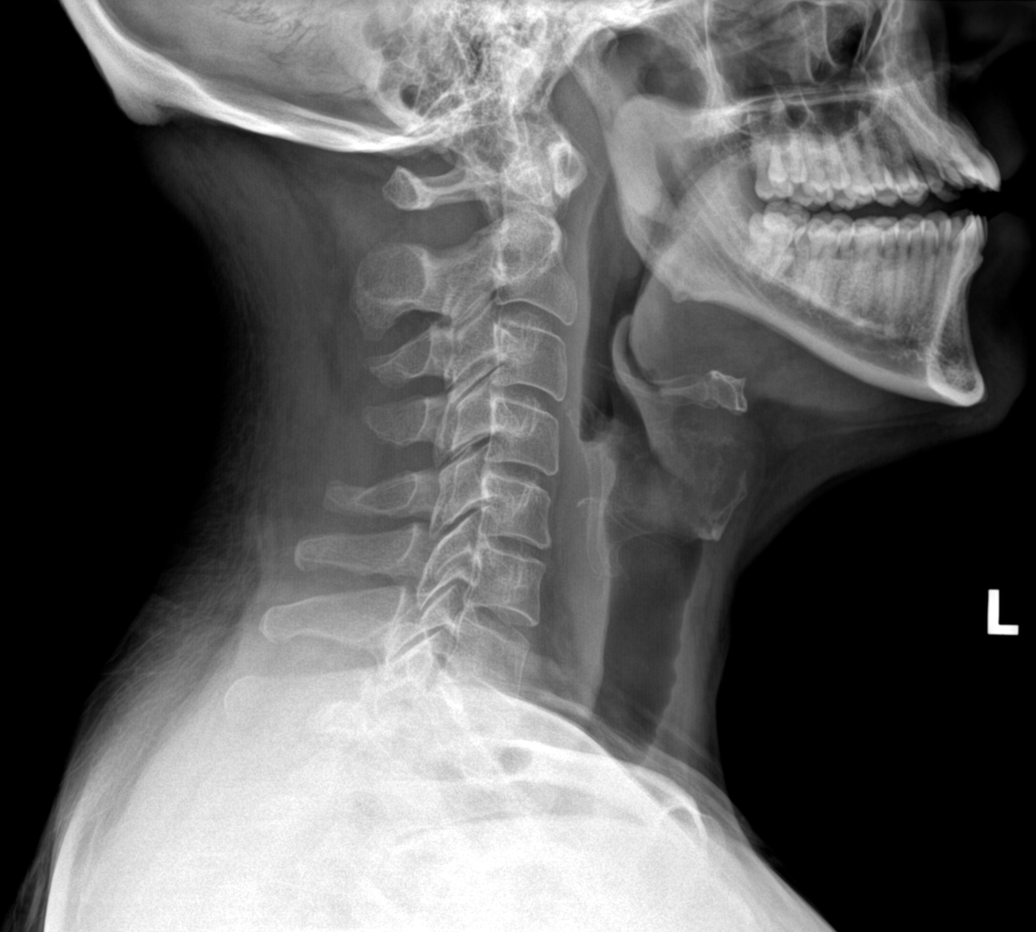
[im 5/6]
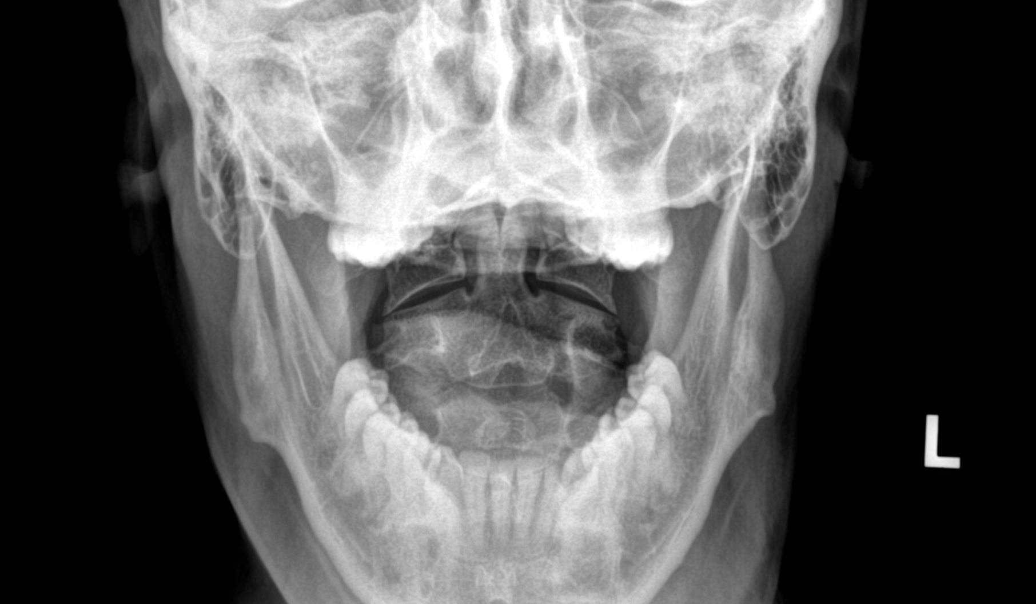
[im 6/6]
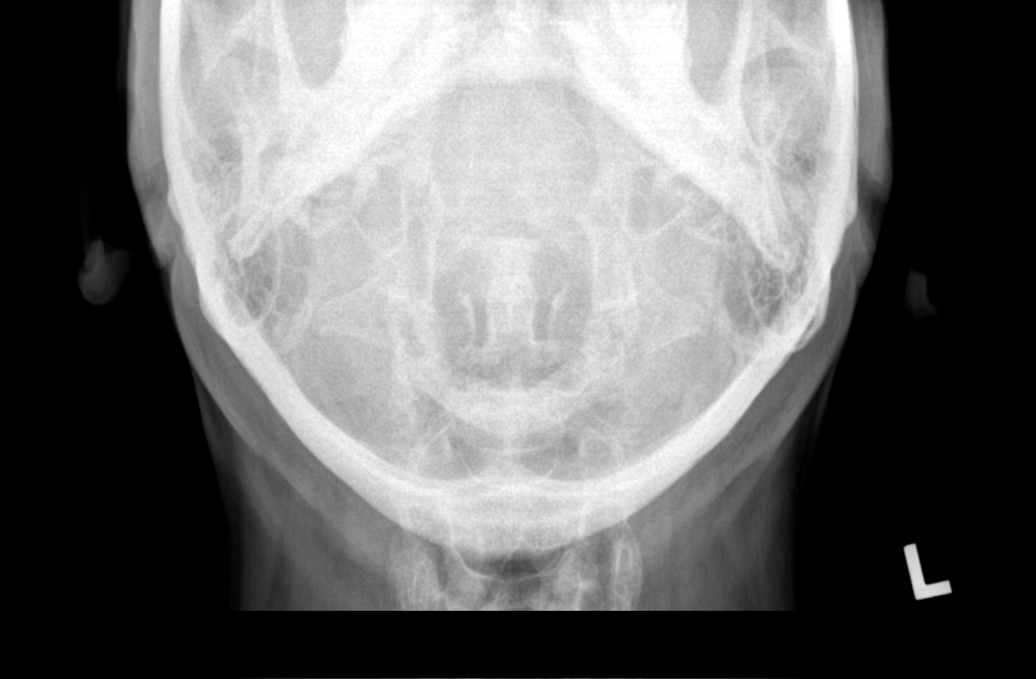

[6 of 6 positions shown; findings below may reference images not displayed]

FINDINGS: RIGHT SHOULDER:  Two views demonstrate no fracture, dislocation, significant degenerative change, lytic or blastic bony lesion, or soft tissue calcification.  

CERVICAL SPINE:  There are mild degenerative changes at multiple levels.  There is no fracture, malalignment, or lytic or blastic bony lesion.  There is mild narrowing of the left C5-C6 neural foramen.  The right neural foramina are suboptimally visualized due to suboptimal positioning, particularly in the lower cervical spine.
IMPRESSION: 1. Normal examination of the right shoulder.  

2. Mild degenerative changes of the cervical spine.

## 2022-10-17 ENCOUNTER — Other Ambulatory Visit: Payer: Self-pay

## 2022-10-17 ENCOUNTER — Ambulatory Visit (HOSPITAL_COMMUNITY)
Admission: RE | Admit: 2022-10-17 | Discharge: 2022-10-17 | Disposition: A | Discharge status: Still In Hospital | Source: Ambulatory Visit | Attending: FAMILY PRACTICE | Admitting: FAMILY PRACTICE

## 2022-10-17 DIAGNOSIS — S46811S Strain of other muscles, fascia and tendons at shoulder and upper arm level, right arm, sequela: Secondary | ICD-10-CM

## 2022-10-17 DIAGNOSIS — S46812S Strain of other muscles, fascia and tendons at shoulder and upper arm level, left arm, sequela: Secondary | ICD-10-CM

## 2022-10-17 NOTE — PT Evaluation (Signed)
[facility]  Outpatient Physical Therapy  [address]  [place], [address]  [phone]  (Fax) [phone]      Physical Therapy Cervical Evaluation    Date: 10/17/2022  Patient's Name: Samuel Tapia  Date of Birth: 06/09/1971    PT diagnosis/Reason for Referral: B trap strain, R shoulder pain and B paraesthesia's.      SUBJECTIVE  Date of onset: Per pt R shoulder pain is chronic in nature which has worsened over the last 3-6 months. Patient notes sudden onset weakness in grip strength with episodes of dropping objects. Per pt his biggest complaint is at shoulder level closer to midline as oppose to [employer]. Abduction position. Pt also notes that after a lot of manual work recently he noticed discomfort in R lateral elbow. Pt notes that putting his hands above his head helps to relieve symptoms in hands.    Medications for this problem: anti-inflammatory    Diagnostic tests:   X-ray: Normal exam of R shoulder with Mild Degenerative changes in cervical spine.  Swallow study recently: Minimal C5-6 disc space narrowing.  \    Patient goals: REDUCE PAIN, RETURN TO WORK, and NORMALIZE FUNCTION    Occupation:  Nurse Practitioner     Pain location: Anterior shoulder, bicipital groove/AC region                    Pain description:  Right shoulder = sharp especially with OH movement. B hands = numbness  in fingers 2-4 (L > R)    Pain frequency:  CONTINUOUS and Paraesthesia  in L hand are constant and R intermittent.    Pain rating: Now 2/10   Best 0/10   Worst 6-7/10    Pain increases with:  Adduction and OH movements            decreases with : POSITION CHANGE and Ibprofuen    Sleep affected: Yes- position of comfort is sleeping on R side. Unable to at this time.    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    Subjective Functional Reports:    Sitting: WFL    Standing: WFL    Walking: WFL    Lifting: LIMITED and worst  closer to midline        Patient-Specific Functional Score:    Problem Score   1. Working out 3   2. Carrying objects without dropping 0    1.5         OBJECTIVE    ROM   right left   rotation [facility] WFL   sidebend N/t N/T   flexion 45 (pain in thoracic region) -------------------------   extension 15  -------------------------     Grip Strength:    right left   Elbow at side 100# 105#   Shoulder at 90* 105# 105#     AROM   right left   Flexion 175 (Pain beginning at 110*) 175   Abduction 165 175   ER C6 (Discomfort) T1   IR PAIN in anterior shoulder/UT      Strength   right left   Shrug (C3-4) [facility] WFL   Shoulder abduction (C5) 4- 4-   Elbow flexion (C6) WFL WFL   Elbow extension (C7) [facility] WFL   Shoulder flexion 4- 4-         Palpation: Tenderness in L bicipital groove, AC joint, Pecs, R >  L suboccipitals    Posture: DECREASED LORDOSIS, FORWARD HEAD, and Able to correct with VI's but requires active attention to maintain.      Special tests:  Cervical clearing = Negative  Painful arch = + on R      Treatment provided:REVIEW OF POC AND GOALS WITH PATIENT, ALL QUESTIONS ANSWERED, PATIENT EDUCATION, THERAPEUTIC EXERCISE , and HEP  Access Code: [email or web address]  URL: [email or web address]  Date: 10/17/2022  Prepared by: Rushie Goltz    Exercises  - Doorway Pec Stretch at 90 Degrees Abduction  - 1-2 x daily - 7 x weekly - 3 reps - 30 sec hold  - Seated Upper Trapezius Stretch  - 1-2 x daily - 7 x weekly - 3 reps - 30 sec hold  - Seated Levator Scapulae Stretch  - 1-2 x daily - 7 x weekly - 3 reps - 30 secs hold  - Supine Cervical Retraction with Towel  - 1-2 x daily - 7 x weekly - 3 sets - 10 reps - 5-10 secs hold  - Seated Scapular Retraction  - 1-2 x daily - 7 x weekly - 3 sets - 10 reps    ASSESSMENT    Impression: Pt is a 51 y/o referreed to PT services for B shoulder pain R > L with parasthesia in B hands (L>R). X-ray suggests mild DDD but no other significant findings. PT evaluation noted faulty movement patterns,  poor shoulder girdle stabilization, muscular imbalances resulting in stabilizing muscles becoming inhibited, neurovascular involvement known by parasthesia. Pt was negative on cervical clearing with differential diagnosis as cervical radiculopathy, thoracic outlet syndrome, as well as shoulder impingement. Pt noted to have painful arch with flexion on R shoulder, when SAT was provided in standing pt reported the pain more in latissimus region that ongoing complaint of anterior shoulder pain. Due to complexity patients symptoms will be montiored to ensure treatment is addressing underlying deficits. Pt had more discomfort with PIVM testing to R cervical region. Pt to benefit from PT services 2x/wk for 8 weeks to address mobility, pain and ultimately improve function to allow pt to return to PLOF restriction free.    Rehab potential: GOOD    Short Term Goals: 4 Weeks   1. Pt to be independent and compliant with HEP and progressions   2. Pt to improve R shoulder AROM to 165 with improve scapular kinematics and no painful arch sign   3. Pt to be improve functional IR of shoulder with no increase of pain   4. Pt to complete cervical flexion ROM without pain in thoracic spine   5. Pt to improve B pectoralis length in order to optimize UE positioning for occupational tasks.      Long Term Goals: 8 Weeks   1. Pt to improve PSFS to > 7/10 indicating significant improvement in function.   2. Pt to normalize scapulohumeral kinematics to decrease painful arch to allow pt to return to home gym restriction fre   3. To decrease frequency of dropping objects from hands to 1 time or less per week   4. Pt to abolish radicular/paraesthesia symptoms down B hands in order to improve QOL.            PLAN  Patient will attend 2 times per week x 8 weeks. Therapy may include, but is not limited to THERAPEUTIC EXERCISES, MYOFASCIAL/JOINT MOBILIZATION, POSTURE/BODY MECHANICS, ERGONOMIC TRAINING, HOME INSTRUCTIONS, ULTRASOUND, ELECTRICAL  STIMULATION, KINESIOTAPE, MECHANICAL TRACTION, and NEURO RE-EDUCATIOIN    Plan for next visit:  Follow general parameters for shoulder impingement/cervical radiculopathy. May want to assess ULNT as see if symptoms are reproduced with neural testing.       Evaluation complexity:   Personal factors impacting POC:  None noted   Co-morbidities impacting POC:  None noted  Complexity of physical exam: INCLUDING MUSCULOSKELETAL SYSTEM (POSTURE, ROM, STRENGTH, HEIGHT/WEIGHT)   Clinical Presentation: STABLE   Evaluation Complexity: LOW-HISTORY 0, EXAMINATION 1-2, STABLE PRESENTATION        Total Session Time 45, Timed code minutes 30, and Untimed code minutes 15        Intervention minutes: EVALUATION 30 minutes and THERAPEUTIC EXERCISE 15 minutes    Rushie Goltz, PT  10/17/2022, 08:07        Start of Service: _________          Certification:    From:______  Through:_________    I certify the need for these services furnished under this plan of treatment and while under my care.    Referring Provider Signature: _______________     Date : _____________________

## 2022-10-19 ENCOUNTER — Other Ambulatory Visit (HOSPITAL_COMMUNITY): Payer: Self-pay | Admitting: FAMILY PRACTICE

## 2022-10-19 DIAGNOSIS — S46811S Strain of other muscles, fascia and tendons at shoulder and upper arm level, right arm, sequela: Secondary | ICD-10-CM

## 2022-10-19 DIAGNOSIS — S46812S Strain of other muscles, fascia and tendons at shoulder and upper arm level, left arm, sequela: Secondary | ICD-10-CM

## 2022-10-20 ENCOUNTER — Other Ambulatory Visit: Payer: Self-pay

## 2022-10-20 ENCOUNTER — Ambulatory Visit (HOSPITAL_COMMUNITY)
Admission: RE | Admit: 2022-10-20 | Discharge: 2022-10-20 | Disposition: A | Discharge status: Still In Hospital | Source: Ambulatory Visit | Attending: FAMILY PRACTICE | Admitting: FAMILY PRACTICE

## 2022-10-20 NOTE — PT Treatment (Signed)
[facility]  Outpatient Physical Therapy  [place], [address]  [phone]  [phone]    Physical Therapy Treatment Note    Date: 10/20/2022  Patient's Name: Samuel Tapia  Date of Birth: 14-Feb-1971            Visit #/POC: 2   Authorization:   POC Signed?: no  POC Ends: 12/12/21  Next Progress Note Due: by 10th visit or 10/17/22      Evaluating Physical Therapist: Jonell Cluck   PT diagnosis/Reason for Referral: B Trap Strain, R shoulder pain and B parasthesias  Next Scheduled Physician Appointment: Dr Tobie Poet neurologist in March  Allergies/Contraindications: none          Subjective: Patient rates pain at rest 0/10 and with aggravating activity 2/10. Continues to have numbness on left.     Objective: Treatment delivered as noted below:    Measured ROM: not measured today  EXERCISE/ACTIVITY NAME REPETITIONS RESISTANCE COMPLETED THIS DOS   Manual cervical distraction     manual Yes    MFR bilateral scalenes, suboccipitals, paraspinals, SCM     manual Yes    PROM rotation and capital extension      manual Yes    Supine cervical retraction   10 rep   Yes HEP   Pec Minor stretch    manual Yes                                  Assessment: Patient's right cervical musculature much tighter than left. Both sides were addressed with manual work however more time was spent on right vs left. Right scalenes and suboccipitals very tight and in spasm and will likely need further intervention. Reviewed supine cervical retractions with patient and he was able to demonstrate correct form. Advised patient to monitor symptoms after today's session and let us know how he feels after treatment.     Short Term Goals: 4 Weeks               1. Pt to be independent and compliant with HEP and progressions               2. Pt to improve R shoulder AROM to 165 with improve scapular kinematics and no painful arch sign               3. Pt to be improve functional IR of shoulder with  no increase of pain               4. Pt to complete cervical flexion ROM without pain in thoracic spine               5. Pt to improve B pectoralis length in order to optimize UE positioning for occupational tasks.        Long Term Goals: 8 Weeks               1. Pt to improve PSFS to > 7/10 indicating significant improvement in function.               2. Pt to normalize scapulohumeral kinematics to decrease painful arch to allow pt to return to home gym restriction fre               3. To decrease frequency of dropping objects from hands to 1 time or less per week  4. Pt to abolish radicular/paraesthesia symptoms down B hands in order to improve QOL.       Plan: Reassess tolerance to today's session and progress as able.     Total Session Time 36 and Timed code minutes 36  JOINT MOBILIZATION/MFR 36 minutes      [employer], PTA  10/20/2022 10:31

## 2022-10-24 ENCOUNTER — Ambulatory Visit (HOSPITAL_COMMUNITY)
Admission: RE | Admit: 2022-10-24 | Discharge: 2022-10-24 | Disposition: A | Discharge status: Still In Hospital | Source: Ambulatory Visit | Attending: FAMILY PRACTICE | Admitting: FAMILY PRACTICE

## 2022-10-24 NOTE — PT Treatment (Signed)
[facility]  Outpatient Physical Therapy  [address]  [place], [address]  [phone]  (Fax) [phone]    Physical Therapy Treatment Note    Date: 10/24/2022  Patient's Name: Samuel Tapia  Date of Birth: 1971/06/16    Visit #/POC: 3/16  Authorization:   POC Signed?: no  POC Ends: 12/12/21  Next Progress Note Due: by 10th visit or 10/17/22        Evaluating Physical Therapist: Rushie Goltz   PT diagnosis/Reason for Referral: B Trap Strain, R shoulder pain and B parasthesias  Next Scheduled Physician Appointment: Dr Sedalia Muta neurologist in March  Allergies/Contraindications: none        Subjective: Patient reports feeling good follow last PT treatment. However he drove to [place] yesterday and when waking this morning had numbness in thumb to 4th finger. Patient notes no discomfort in R shoulder at rest but continues to have a lot of discomfort when crossing midline and when coupled with shoulder flexion.      Objective: Treatment delivered as noted below:  Right shoulder held in a relatively depressed position with increased slop of R clavicle.   + Painful arch on R  + Scapular assistance test  N/T into L hands     Measured ROM: not measured today  EXERCISE/ACTIVITY NAME REPETITIONS RESISTANCE COMPLETED THIS DOS   Manual cervical distraction      manual Yes    MFR bilateral scalenes, suboccipitals, paraspinals, SCM      manual Yes    PROM rotation and capital extension       manual No   Supine cervical retraction    10 rep   No + HEP    Pec Minor stretch      manual Yes    Posterior capsule stretch  (Felt in AC joint)  Yes- deferred    Side lying R shoulder abduction + Scapular assistance  2x15 (therapist weans around 5-8) Manual + AROM Yes   Side lying R shoulder flexion + scapular assistance  2x15 (therapist weans around 5-8 reps) Manual + AROM Yes   Low trap setting at wall with foam roller  2x10   Yes                      Assessment: Treatment today focused on R  scapular kinematics with pt noted to hold R shoulder in a relatively depressed position with increased angle of R clavicle. Patient was found to have + scapular assistance test with reduction in pain/symptoms when therapist facilitated normal scapular kinematics. Introduced sidelying motor control training to decrease GH compensations for lag of scapular engagement. When scapular assistance was not provided pt had excessive R GH movement to compensate for scapular dyskinesia    Short Term Goals: 4 Weeks               1. Pt to be independent and compliant with HEP and progressions               2. Pt to improve R shoulder AROM to 165 with improve scapular kinematics and no painful arch sign               3. Pt to be improve functional IR of shoulder with no increase of pain               4. Pt to complete cervical flexion ROM without pain in thoracic spine  5. Pt to improve B pectoralis length in order to optimize UE positioning for occupational tasks.        Long Term Goals: 8 Weeks               1. Pt to improve PSFS to > 7/10 indicating significant improvement in function.               2. Pt to normalize scapulohumeral kinematics to decrease painful arch to allow pt to return to home gym restriction fre               3. To decrease frequency of dropping objects from hands to 1 time or less per week               4. Pt to abolish radicular/paraesthesia symptoms down B hands in order to improve QOL.        Plan: Continue normalizing scapular kinematics of R shoulder and assess cervical for radicular involvement into L hand. May introduce nerve glides or focus more on cervical mobility/stability.    Total Session Time 37 and Timed code minutes 37  THERAPEUTIC EXERCISE 37 minutes      Rushie Goltz, PT  10/24/2022, 09:41

## 2022-10-25 LAB — LAB: COLOGUARD RESULT: NEGATIVE

## 2022-10-26 ENCOUNTER — Other Ambulatory Visit: Payer: Self-pay

## 2022-10-26 ENCOUNTER — Ambulatory Visit (HOSPITAL_COMMUNITY)
Admission: RE | Admit: 2022-10-26 | Discharge: 2022-10-26 | Disposition: A | Discharge status: Still In Hospital | Source: Ambulatory Visit | Attending: FAMILY PRACTICE | Admitting: FAMILY PRACTICE

## 2022-10-26 NOTE — PT Treatment (Signed)
[facility]  Outpatient Physical Therapy  [address]  [place], [address]  [phone]  (Fax) [phone]    Physical Therapy Treatment Note    Date: 10/26/2022  Patient's Name: Samuel Tapia  Date of Birth: May 22, 1971        Visit #/POC: 4/16  Authorization:   POC Signed?: no  POC Ends: 12/12/21  Next Progress Note Due: by 10th visit or 10/17/22        Evaluating Physical Therapist: Rushie Goltz   PT diagnosis/Reason for Referral: B Trap Strain, R shoulder pain and B parasthesias  Next Scheduled Physician Appointment: Dr Sedalia Muta neurologist in March  Allergies/Contraindications: none        Subjective: Patient reports his neck and shoulder are doing better compared to when he drove to [place]. States no numbness at the moment in his fingers. States his shoulder has no pain at rest, but if he crosses midline with flexion he has pain in the shoulder joint.        Objective: Treatment delivered as noted below:  12/19  Right shoulder held in a relatively depressed position with increased slop of R clavicle.   + Painful arch on R  + Scapular assistance test  N/T into L hands     Measured ROM: not measured today  EXERCISE/ACTIVITY NAME REPETITIONS RESISTANCE COMPLETED THIS DOS   Manual cervical distraction      manual Yes    MFR bilateral scalenes, suboccipitals, paraspinals, SCM      manual Yes    PROM rotation and capital extension       manual No   Supine cervical retraction    10 rep   No + HEP    Pec Minor stretch      manual Yes    Posterior capsule stretch  (Felt in AC joint)   Yes- deferred    Side lying R shoulder abduction + Scapular assistance  2x15 (therapist weans around 5-8) Manual + AROM No   Side lying R shoulder flexion + scapular assistance  2x15 (therapist weans around 5-8 reps) Manual + AROM No   Low trap setting at wall with foam roller  2x10   Yes    Thoracic mob PA    Grade II  Yes   Stabilization ball on wall with wall pulleys x20  yes             Assessment: Noted decrease UT activation with ther ex following VC and tactile cueing. Noted relaxation of muscle tone following MFR. No adverse effects with treatment noted.      Short Term Goals: 4 Weeks               1. Pt to be independent and compliant with HEP and progressions               2. Pt to improve R shoulder AROM to 165 with improve scapular kinematics and no painful arch sign               3. Pt to be improve functional IR of shoulder with no increase of pain               4. Pt to complete cervical flexion ROM without pain in thoracic spine               5. Pt to improve B pectoralis length in order to optimize UE positioning for occupational tasks.  Long Term Goals: 8 Weeks               1. Pt to improve PSFS to > 7/10 indicating significant improvement in function.               2. Pt to normalize scapulohumeral kinematics to decrease painful arch to allow pt to return to home gym restriction fre               3. To decrease frequency of dropping objects from hands to 1 time or less per week               4. Pt to abolish radicular/paraesthesia symptoms down B hands in order to improve QOL.        Plan: Continue normalizing scapular kinematics of R shoulder and assess cervical for radicular involvement into L hand. Update HEP        Total Session Time 38 and Timed code minutes 38  THERAPEUTIC EXERCISE 23 minutes and JOINT MOBILIZATION/MFR 15 minutes      Trilby Leaver, PTA  10/26/2022, 07:55

## 2022-10-31 ENCOUNTER — Ambulatory Visit (HOSPITAL_COMMUNITY)
Admission: RE | Admit: 2022-10-31 | Discharge: 2022-10-31 | Disposition: A | Discharge status: Still In Hospital | Source: Ambulatory Visit | Attending: FAMILY PRACTICE | Admitting: FAMILY PRACTICE

## 2022-10-31 NOTE — PT Treatment (Signed)
[facility]  Outpatient Physical Therapy  [address]  [place], [address]  [phone]  (Fax) [phone]    Physical Therapy Treatment Note    Date: 10/31/2022  Patient's Name: Samuel Tapia  Date of Birth: [birth year]      Visit #/POC: 5/16  Authorization:   POC Signed?: no  POC Ends: 12/12/21  Next Progress Note Due: by 10th visit or 10/17/22        Evaluating Physical Therapist: Rushie Goltz   PT diagnosis/Reason for Referral: B Trap Strain, R shoulder pain and B parasthesias  Next Scheduled Physician Appointment: Dr Sedalia Muta neurologist in March  Allergies/Contraindications: none        Subjective: Patient notes a reduction in the frequency of N/T and parasthenia in B hands following cervical work and nerve glides last session. Patient does not a lot of discomfort in R lateral elbow almost consistent with lateral epicondylitis. Patient notes that he has been working on scapular stabilizers and kinematics and notes improvement with forward flexion.  States his shoulder has no pain at rest, but if he crosses midline with flexion he has pain in the shoulder joint. Patient notes going to the shooting range intermittently and its getting difficult to pull the gun out.        Objective: Treatment delivered as noted below:  12/19  Right shoulder held in a relatively depressed position with increased slop of R clavicle.   + Painful arch on R  + Scapular assistance test  N/T into L hands    10/31/22  Pain with resisted pronation  Trigger point tenderness along the R wrist extensors   Tenderness over radial head and A/P mobs     Measured ROM: not measured today  EXERCISE/ACTIVITY NAME REPETITIONS RESISTANCE COMPLETED THIS DOS   Manual cervical distraction      manual No    MFR bilateral scalenes, suboccipitals, paraspinals, SCM      manual No   PROM rotation and capital extension       manual No   Supine cervical retraction    10 rep   No + HEP    Pec Minor stretch      manual No    Posterior capsule stretch  (Felt in AC joint)   No- deferred    Side lying R shoulder abduction + Scapular assistance  2x15 (therapist weans around 5-8) Manual + AROM No   Side lying R shoulder flexion + scapular assistance  2x15 (therapist weans around 5-8 reps) Manual + AROM No   Low trap setting at wall with foam roller  2x10   No    Thoracic mob PA    Grade II  No   Stabilization ball on wall with wall pulleys x20   No   [place] + E-stim combination to R lateral elbow  X8 mins  Yes   Cross friction massage to extensor tendons   Yes   Eccentric wrist flexion/extension 2x10 Yellow Yes + HEP   Eccentric pronation/supination  2x10  Yellow yES + HEP   Wrist flexor stretch 3x30  Yes + HEP   Wrist extensor stretch 3x30  Yes + HEP   KT tape for R lateral epicondylitis   Yes   Radial head mobilizations  G1-2 Yes            Assessment: Patient was originally referred for R shoulder vs cervical involvement. Patient wanted to focus on his R elbow and hand symptoms today and  he's noticing it start to impact day to day life. After [place] + E-stim combination pt initially felt increased tension in that region which eased with movement. Reviewed lateral epicondylitis HEP with patient refining movement sequencing and application of KT tape to help with proprioceptive input and  distribute force load. Patient noted to have no adverse reactions and educated on ice massage at home.       Short Term Goals: 4 Weeks               1. Pt to be independent and compliant with HEP and progressions               2. Pt to improve R shoulder AROM to 165 with improve scapular kinematics and no painful arch sign               3. Pt to be improve functional IR of shoulder with no increase of pain               4. Pt to complete cervical flexion ROM without pain in thoracic spine               5. Pt to improve B pectoralis length in order to optimize UE positioning for occupational tasks.        Long Term Goals: 8 Weeks               1. Pt to improve  PSFS to > 7/10 indicating significant improvement in function.               2. Pt to normalize scapulohumeral kinematics to decrease painful arch to allow pt to return to home gym restriction fre               3. To decrease frequency of dropping objects from hands to 1 time or less per week               4. Pt to abolish radicular/paraesthesia symptoms down B hands in order to improve QOL.        Plan: Continue normalizing scapular kinematics of R shoulder and assess cervical for radicular involvement into L hand. Assess response to KT tape and epicondylitis           Total Session Time 37, Timed code minutes 29, and Untimed code minutes 8  THERAPEUTIC EXERCISE 29 minutes and ULTRASOUND/E-STIM COMBO 8 minutes      Jonell Cluck, PT  10/31/2022, 12:40

## 2022-11-07 ENCOUNTER — Ambulatory Visit (HOSPITAL_COMMUNITY): Payer: Self-pay

## 2022-11-08 ENCOUNTER — Other Ambulatory Visit: Payer: Self-pay

## 2022-11-08 ENCOUNTER — Ambulatory Visit (HOSPITAL_COMMUNITY)
Admission: RE | Admit: 2022-11-08 | Discharge: 2022-11-08 | Disposition: A | Discharge status: Still In Hospital | Source: Ambulatory Visit | Attending: FAMILY PRACTICE | Admitting: FAMILY PRACTICE

## 2022-11-08 NOTE — PT Treatment (Addendum)
[facility]  Outpatient Physical Therapy  [place], [address]  [phone]  [phone]    Physical Therapy Treatment Note    Date: 11/08/2022  Patient's Name: Samuel Tapia  Date of Birth: [birth year]        Visit #/POC: 6/16  Authorization:   POC Signed?: no  POC Ends: 12/12/21  Next Progress Note Due: by 10th visit or 10/17/22        Evaluating Physical Therapist: Jonell Cluck   PT diagnosis/Reason for Referral: B Trap Strain, R shoulder pain and B parasthesias  Next Scheduled Physician Appointment: Dr Tobie Poet neurologist in March  Allergies/Contraindications: none        Subjective: Patient reports he has only had one episode of numbness and tingling in his (L) fingers. Satte s the elbow has improved following his last visit. States that he did get him an elbow brace. States he is compliant with his HEP.     Objective: Treatment delivered as noted below:  12/19  Right shoulder held in a relatively depressed position with increased slop of R clavicle.   + Painful arch on R  + Scapular assistance test  N/T into L hands     10/31/22  Pain with resisted pronation  Trigger point tenderness along the R wrist extensors   Tenderness over radial head and A/P mobs     Measured ROM: not measured today  EXERCISE/ACTIVITY NAME REPETITIONS RESISTANCE COMPLETED THIS DOS   Manual cervical distraction      manual No    MFR bilateral scalenes, suboccipitals, paraspinals, SCM      manual No   PROM rotation and capital extension       manual No   Supine cervical retraction    10 rep   No + HEP    Pec Minor stretch      manual No   Posterior capsule stretch  (Felt in AC joint)   No- deferred    Side lying R shoulder abduction + Scapular assistance  2x15 (therapist weans around 5-8) Manual + AROM No   Side lying R shoulder flexion + scapular assistance  2x15 (therapist weans around 5-8 reps) Manual + AROM No   Low trap setting at wall with foam roller  2x10   No    Thoracic  mob PA    Grade II  No   Stabilization ball on wall with wall pulleys x20   No   [place] + E-stim combination to R lateral elbow  X8 mins   No   Cross friction massage to extensor tendons     Yes   Eccentric wrist flexion/extension 2x10 Yellow Yes + HEP   Eccentric pronation/supination  2x10  Yellow yES + HEP   Wrist flexor stretch 3x30   Yes + HEP   Wrist extensor stretch 3x30   Yes + HEP   KT tape for R lateral epicondylitis     No   Radial head mobilizations   G1-2 Yes   UBE f/b 5 min 60 rpm Yes   Serratus roll  x15 Green t band yes            Assessment: Patient displays improved posture. No compensatory strategies noted with therex. Reports only one brief episode of radicular symptoms since 12/26. TTP with cross friction massage to extensor tendons.        Short Term Goals: 4 Weeks  1. Pt to be independent and compliant with HEP and progressions MET 1/3               2. Pt to improve R shoulder AROM to 165 with improve scapular kinematics and no painful arch sign               3. Pt to be improve functional IR of shoulder with no increase of pain               4. Pt to complete cervical flexion ROM without pain in thoracic spine               5. Pt to improve B pectoralis length in order to optimize UE positioning for occupational tasks.        Long Term Goals: 8 Weeks               1. Pt to improve PSFS to > 7/10 indicating significant improvement in function.               2. Pt to normalize scapulohumeral kinematics to decrease painful arch to allow pt to return to home gym restriction fre               3. To decrease frequency of dropping objects from hands to 1 time or less per week               4. Pt to abolish radicular/paraesthesia symptoms down B hands in order to improve QOL.        Plan: Continue normalizing scapular kinematics of R shoulder and assess cervical for radicular involvement into L hand. KT tape and [place] as needed per POC. Assess ROM and goals.            Total Session Time 30 and  Timed code minutes 30  THERAPEUTIC EXERCISE 22 minutes and JOINT MOBILIZATION/MFR 8 minutes      Carlton Adam, PTA  11/08/2022, 08:01

## 2022-11-09 ENCOUNTER — Ambulatory Visit (HOSPITAL_COMMUNITY): Payer: Self-pay

## 2022-11-10 ENCOUNTER — Ambulatory Visit (HOSPITAL_COMMUNITY)
Admission: RE | Admit: 2022-11-10 | Discharge: 2022-11-10 | Disposition: A | Discharge status: Still In Hospital | Source: Ambulatory Visit | Attending: FAMILY PRACTICE | Admitting: FAMILY PRACTICE

## 2022-11-10 ENCOUNTER — Other Ambulatory Visit: Payer: Self-pay

## 2022-11-10 NOTE — PT Treatment (Signed)
[facility]  Outpatient Physical Therapy  [place], [address]  [phone]  [phone]    Physical Therapy Treatment Note    Date: 11/10/2022  Patient's Name: Samuel Tapia  Date of Birth: 05/28/1971        Visit #/POC: 7/16  Authorization:   POC Signed?: no  POC Ends: 12/12/21  Next Progress Note Due: by 10th visit or 10/17/22        Evaluating Physical Therapist: Jonell Cluck   PT diagnosis/Reason for Referral: B Trap Strain, R shoulder pain and B parasthesias  Next Scheduled Physician Appointment: Dr Tobie Poet neurologist in March  Allergies/Contraindications: none        Subjective: Patient reports his neck and radicular symptoms into (R) LE has improved both intensity and frequency.  States that he continues to have pain raising shoulder in flexion at      Objective: Treatment delivered as noted below:  12/19  Right shoulder held in a relatively depressed position with increased slop of R clavicle.   + Painful arch on R  + Scapular assistance test  N/T into L hands     10/31/22  Pain with resisted pronation  Trigger point tenderness along the R wrist extensors   Tenderness over radial head and A/P mobs     Measured ROM: not measured today  EXERCISE/ACTIVITY NAME REPETITIONS RESISTANCE COMPLETED THIS DOS   Manual cervical distraction      manual No    MFR bilateral scalenes, suboccipitals, paraspinals, SCM      manual No   PROM rotation and capital extension       manual No   Supine cervical retraction    10 rep   No + HEP    Pec Minor stretch      manual yes   Posterior capsule stretch  (Felt in AC joint)   No- deferred    Side lying R shoulder abduction + Scapular assistance  2x15 (therapist weans around 5-8) Manual + AROM No   Side lying R shoulder flexion + scapular assistance  2x15 (therapist weans around 5-8 reps) Manual + AROM No   Low trap setting at wall with foam roller  2x10   No    Thoracic mob PA    Grade II  No   Stabilization ball on wall  with wall pulleys x20   No   [place] + E-stim combination to R lateral elbow  X8 mins   Yes   Cross friction massage to extensor tendons     Yes   Eccentric wrist flexion/extension 2x10 Yellow Yes + HEP   Eccentric pronation/supination  2x10  Yellow YES + HEP   Wrist flexor stretch 3x30   Yes + HEP   Wrist extensor stretch 3x30   Yes + HEP   KT tape for R lateral epicondylitis     No   Radial head mobilizations   G1-2 no   UBE f/b 5 min 60 rpm no   Serratus roll  x15 Green t band no   Shoulder distraction   manual yes   Supination (R)   manual yes             Access Code: [email or web address]  URL: [email or web address]  Date: 11/10/2022  Prepared by: Carlton Adam    Exercises  - Doorway Pec Stretch at 90 Degrees Abduction  - 2 x daily - 7 x weekly - 1 sets - 10 reps    Assessment:  Noted tightness in pectoralis mm and also forearm supination. Noted relaxation of muscle tone following STM. Noted full ROM of shoulder with pain at in anterior aspect of shoulder at 90 degrees of flexion. No pain noted in all other planes.        Short Term Goals: 4 Weeks               1. Pt to be independent and compliant with HEP and progressions MET 1/3               2. Pt to improve R shoulder AROM to 165 with improve scapular kinematics and no painful arch sign               3. Pt to be improve functional IR of shoulder with no increase of pain               4. Pt to complete cervical flexion ROM without pain in thoracic spine               5. Pt to improve B pectoralis length in order to optimize UE positioning for occupational tasks.        Long Term Goals: 8 Weeks               1. Pt to improve PSFS to > 7/10 indicating significant improvement in function.               2. Pt to normalize scapulohumeral kinematics to decrease painful arch to allow pt to return to home gym restriction fre               3. To decrease frequency of dropping objects from hands to 1 time or less per week               4. Pt to abolish radicular/paraesthesia  symptoms down B hands in order to improve QOL.        Plan: Continue normalizing scapular kinematics of R shoulder and assess cervical for radicular involvement into L hand. KT tape and [place] as needed per POC. Assess ROM and goals.        Total Session Time 30 and Timed code minutes 30  THERAPEUTIC EXERCISE 20 minutes and JOINT MOBILIZATION/MFR 10 minutes      Carlton Adam, PTA  11/10/2022, 11:45

## 2022-11-14 ENCOUNTER — Ambulatory Visit
Admission: RE | Admit: 2022-11-14 | Discharge: 2022-11-14 | Disposition: A | Discharge status: Still In Hospital | Source: Ambulatory Visit | Attending: FAMILY PRACTICE | Admitting: FAMILY PRACTICE

## 2022-11-14 NOTE — PT Treatment (Signed)
[facility]  Outpatient Physical Therapy  [address]  [place], [address]  [phone]  (Fax) [phone]    Physical Therapy Treatment Note    Date: 11/14/2022  Patient's Name: Samuel Tapia  Date of Birth: 11/04/1971        Visit #/POC: 8/16  Authorization:   POC Signed?: no  POC Ends: 12/12/21  Next Progress Note Due: by 10th visit or 10/17/22        Evaluating Physical Therapist: Rushie Goltz   PT diagnosis/Reason for Referral: B Trap Strain, R shoulder pain and B parasthesias  Next Scheduled Physician Appointment: Dr Sedalia Muta neurologist in March  Allergies/Contraindications: none        Subjective: Patient reports symptoms are improving. He has only had the numbness/ tingling in his hand 2 times since his previous visit, and these were short bouts.      Objective: Treatment delivered as noted below:       Measured ROM: not measured today  EXERCISE/ACTIVITY NAME REPETITIONS RESISTANCE COMPLETED THIS DOS   Manual cervical distraction      manual No    MFR bilateral scalenes, suboccipitals, paraspinals, SCM      manual No   PROM rotation and capital extension       manual No   Supine cervical retraction with rotation    10x back and forth    Y   Pec Minor stretch      manual yes   Posterior capsule stretch  (Felt in AC joint)   No- deferred    Side lying R shoulder abduction + Scapular assistance  2x15 (therapist weans around 5-8) Manual + AROM No   Side lying R shoulder flexion + scapular assistance  2x15 (therapist weans around 5-8 reps) Manual + AROM No   Low trap setting at wall with foam roller  2x10   No    Thoracic mob PA    Grade II No   Stabilization ball on wall with wall pulleys x20   No   [place] + E-stim combination to R lateral elbow  X8 mins   N   Cross friction massage to extensor tendons     N   Eccentric wrist flexion/extension 2x10 Yellow N + HEP   Eccentric pronation/supination  2x10  Yellow N + HEP   Wrist flexor stretch 3x30   N + HEP   Wrist extensor  stretch 3x30   N + HEP   KT tape for R lateral epicondylitis     No   Radial head mobilizations   G1-2 no   UBE f/b 5 min 60 rpm no   Serratus roll  x15 Green t band Y   Shoulder distraction   manual N   Supination (R)   manual N   Tband ER @ 90/90 w/ shoulder press x15 YTB Y   Foam roller work  - chest opener  - thoracic extension rolling  - lat rolling bilat with active Release 10 min  Foam roller  Y    ADL/ Patient ed  - discussed antagonist/protagonist muscles  - discussed gyroball, exercise peanut, and additional foam roller work.            Assessment: Samuel Tapia tolerated treatment well today. We did need to correct the overactivation of cervical extension during his chin tucks. He reported understanding the difference as it was explained. We also related this to antagonist/protagonist relationships he is experiencing with his neck and shoulder (rotator cuff  vs. Pecs/lats). This improved his understanding. We emphasized soft tissue work today of pecs, lats, and rotator cuff along with education about this. Patient reported appreciation of adjustments. Further intervention necessary.         Short Term Goals: 4 Weeks               1. Pt to be independent and compliant with HEP and progressions MET 1/3               2. Pt to improve R shoulder AROM to 165 with improve scapular kinematics and no painful arch sign               3. Pt to be improve functional IR of shoulder with no increase of pain               4. Pt to complete cervical flexion ROM without pain in thoracic spine               5. Pt to improve B pectoralis length in order to optimize UE positioning for occupational tasks.        Long Term Goals: 8 Weeks               1. Pt to improve PSFS to > 7/10 indicating significant improvement in function.               2. Pt to normalize scapulohumeral kinematics to decrease painful arch to allow pt to return to home gym restriction fre               3. To decrease frequency of dropping objects from hands to 1  time or less per week               4. Pt to abolish radicular/paraesthesia symptoms down B hands in order to improve QOL.        Plan: Continue POC per initial evaluation and progress the patient as necessary.       Total Session Time 45 and Timed code minutes 45  THERAPEUTIC EXERCISE 35 minutes and JOINT MOBILIZATION/MFR 0 minutesADL/ Patient ed: 10 min       Sheran Fava, PT  11/14/2022 09:22

## 2022-11-16 ENCOUNTER — Ambulatory Visit (HOSPITAL_COMMUNITY)
Admission: RE | Admit: 2022-11-16 | Discharge: 2022-11-16 | Disposition: A | Discharge status: Still In Hospital | Source: Ambulatory Visit | Attending: FAMILY PRACTICE | Admitting: FAMILY PRACTICE

## 2022-11-16 DIAGNOSIS — S46811S Strain of other muscles, fascia and tendons at shoulder and upper arm level, right arm, sequela: Secondary | ICD-10-CM | POA: Insufficient documentation

## 2022-11-16 DIAGNOSIS — S46812S Strain of other muscles, fascia and tendons at shoulder and upper arm level, left arm, sequela: Secondary | ICD-10-CM | POA: Insufficient documentation

## 2022-11-16 NOTE — PT Treatment (Signed)
[facility]  Outpatient Physical Therapy  [place], [address]  [phone]  [phone]    Physical Therapy Treatment Note    Date: 11/16/2022  Patient's Name: Samuel Tapia  Date of Birth: 12/28/1970        Visit #/POC: 9/16  Authorization:   POC Signed?: no  POC Ends: 12/12/21  Next Progress Note Due: by 10th visit or 10/17/22        Evaluating Physical Therapist: Jonell Cluck   PT diagnosis/Reason for Referral: B Trap Strain, R shoulder pain and B parasthesias  Next Scheduled Physician Appointment: Dr Tobie Poet neurologist in March  Allergies/Contraindications: none        Subjective: Samuel Tapia reports he is still sore today from his previous appointment. He is having some anterior shoulder soreness. He is not sure which of the exercises caused this though.      Objective: Treatment delivered as noted below:       Measured ROM: not measured today  EXERCISE/ACTIVITY NAME REPETITIONS RESISTANCE COMPLETED THIS DOS   Manual cervical distraction      manual No    MFR bilateral scalenes, suboccipitals, paraspinals, SCM      manual No   PROM rotation and capital extension       manual No   Supine cervical retraction with rotation    10x back and forth    Y   Pec Minor stretch      manual yes   Posterior capsule stretch  (Felt in AC joint)   No- deferred    Side lying R shoulder abduction + Scapular assistance  2x15 (therapist weans around 5-8) Manual + AROM No   Side lying R shoulder flexion + scapular assistance  2x15 (therapist weans around 5-8 reps) Manual + AROM No   Low trap setting at wall with foam roller  2x10   No    Thoracic mob PA    Grade II No   Stabilization ball on wall with wall pulleys x20   No   [place] + E-stim combination to R lateral elbow  X8 mins   N   Cross friction massage to extensor tendons     N   Eccentric wrist flexion/extension 2x10 Yellow N + HEP   Eccentric pronation/supination  2x10  Yellow N + HEP   Wrist flexor stretch 3x30   N + HEP    Wrist extensor stretch 3x30   N + HEP   KT tape for R lateral epicondylitis     No   Radial head mobilizations   G1-2 no   UBE f/b 5 min 60 rpm no   Serratus roll  x15 Green t band Y   Shoulder distraction   manual N   Supination (R)   manual N   Tband ER @ 90/90 w/ shoulder press x15 YTB Y   Foam roller work  - chest opener  - thoracic extension rolling  - lat rolling bilat with active Release 10 min  Foam roller  Y    ADL/ Patient ed  - discussed antagonist/protagonist muscles  - discussed gyroball, exercise peanut, and additional foam roller work.          Access Code: [email or web address]  URL: [email or web address]  Date: 11/16/2022  Prepared by: Sheran Fava    Exercises  - Standing Median Nerve Glide  - 2-3 x daily - 20 reps  - Supine Chest Stretch on Foam Roll   - Thoracic Foam  Roll Mobilization Backstroke   - [employer] on [employer]  - 10 reps  - Prone Chest Stretch on Chair  - 2 reps - 30 seconds hold  - scapular wall slides  - 2-3 x daily - 10-20 reps  - Dead Bug with [employer]  - 3 sets - 10 reps    Assessment: Samuel Tapia tolerated treatment fair today. He did have some increased shoulder, lateral arm, and posterior arm symptoms with additional treatments. He was asking for extra exercises today as he has some extra time on his hands, so we provided an updated HEP as well. He has some continued R shoulder weakness also. Further intervention necessary.         Short Term Goals: 4 Weeks               1. Pt to be independent and compliant with HEP and progressions MET 1/3               2. Pt to improve R shoulder AROM to 165 with improve scapular kinematics and no painful arch sign               3. Pt to be improve functional IR of shoulder with no increase of pain               4. Pt to complete cervical flexion ROM without pain in thoracic spine               5. Pt to improve B pectoralis length in order to optimize UE positioning for occupational tasks.        Long Term Goals: 8 Weeks               1. Pt  to improve PSFS to > 7/10 indicating significant improvement in function.               2. Pt to normalize scapulohumeral kinematics to decrease painful arch to allow pt to return to home gym restriction fre               3. To decrease frequency of dropping objects from hands to 1 time or less per week               4. Pt to abolish radicular/paraesthesia symptoms down B hands in order to improve QOL.        Plan: Continue POC per initial evaluation and progress the patient as necessary.       Total Session Time 45 and Timed code minutes 45  THERAPEUTIC EXERCISE 35 minutes and JOINT MOBILIZATION/MFR 0 minutesADL/ Patient ed: 10 min      Sheran Fava, PT  11/16/2022 08:12

## 2022-11-21 ENCOUNTER — Ambulatory Visit (HOSPITAL_COMMUNITY)
Admission: RE | Admit: 2022-11-21 | Discharge: 2022-11-21 | Disposition: A | Discharge status: Still In Hospital | Source: Ambulatory Visit | Attending: FAMILY PRACTICE | Admitting: FAMILY PRACTICE

## 2022-11-21 ENCOUNTER — Other Ambulatory Visit: Payer: Self-pay

## 2022-11-21 NOTE — PT Treatment (Addendum)
[facility]  Outpatient Physical Therapy  [place], [address]  [phone]  [phone]    Physical Therapy Treatment Note    Date: 11/21/2022  Patient's Name: Samuel Tapia  Date of Birth: 1971/10/01      Visit #/POC: 10/16  Authorization:   POC Signed?: no  POC Ends: 12/12/21  Next Progress Note Due: by 10th visit or 10/17/22        Evaluating Physical Therapist: Jonell Cluck   PT diagnosis/Reason for Referral: B Trap Strain, R shoulder pain and B parasthesias  Next Scheduled Physician Appointment: Dr Tobie Poet neurologist in March  Allergies/Contraindications: none        Subjective:Patient states that he has only had one episode of numbness in right hand/fingers. States that his main complaint is  he continues to have pain in his elbow/shoulder.      Objective: Treatment delivered as noted below:        Measured ROM: not measured today  EXERCISE/ACTIVITY NAME REPETITIONS RESISTANCE COMPLETED THIS DOS   Manual cervical distraction      manual No    MFR bilateral scalenes, suboccipitals, paraspinals, SCM      manual No   PROM rotation and capital extension       manual No   Supine cervical retraction with rotation    10x back and forth    Y   Pec Minor stretch      manual yes   Posterior capsule stretch  (Felt in AC joint)   No- deferred    Side lying R shoulder abduction + Scapular assistance  2x15 (therapist weans around 5-8) Manual + AROM No   Side lying R shoulder flexion + scapular assistance  2x15 (therapist weans around 5-8 reps) Manual + AROM No   Low trap setting at wall with foam roller  2x10   No    Thoracic mob PA    Grade II No   Stabilization ball on wall with wall pulleys x20   No   [place] + E-stim combination to R lateral elbow  X8 mins   Y [place] only   Cross friction massage to extensor tendons     y   Eccentric wrist flexion/extension 2x10 Yellow N + HEP   Eccentric pronation/supination  2x10  Yellow N + HEP   Wrist flexor stretch 3x30   y+ HEP    Wrist extensor stretch 3x30   y+ HEP   KT tape for R lateral epicondylitis     No   Radial head mobilizations   G1-2 y   UBE f/b 5 min 60 rpm no   Serratus roll  x15 Green t band n   Shoulder distraction    manual N   Supination (R)    manual N   Tband ER @ 90/90 w/ shoulder press x15 YTB n   Foam roller work  - chest opener  - thoracic extension rolling  - lat rolling bilat with active Release 10 min  Foam roller  n   ADL/ Patient ed  - discussed antagonist/protagonist muscles  - discussed gyroball, exercise peanut, and additional foam roller work.                Assessment: Noted relaxation of muscle tone following cross friction and [place]. Patient reports decrease tightness. No adverse effects noted with treatment.         Short Term Goals: 4 Weeks  1. Pt to be independent and compliant with HEP and progressions MET 1/3               2. Pt to improve R shoulder AROM to 165 with improve scapular kinematics and no painful arch sign               3. Pt to be improve functional IR of shoulder with no increase of pain               4. Pt to complete cervical flexion ROM without pain in thoracic spine               5. Pt to improve B pectoralis length in order to optimize UE positioning for occupational tasks.        Long Term Goals: 8 Weeks               1. Pt to improve PSFS to > 7/10 indicating significant improvement in function.               2. Pt to normalize scapulohumeral kinematics to decrease painful arch to allow pt to return to home gym restriction fre               3. To decrease frequency of dropping objects from hands to 1 time or less per week               4. Pt to abolish radicular/paraesthesia symptoms down B hands in order to improve QOL.        Plan: Continue POC per initial evaluation and progress the patient as necessary.            Total Session Time 30 and Timed code minutes 30  THERAPEUTIC EXERCISE 22 minutes and ULTRASOUND/E-STIM COMBO 8 minutesMFR included with therex      Carlton Adam, PTA  11/21/2022, 08:12

## 2022-11-23 ENCOUNTER — Ambulatory Visit
Admission: RE | Admit: 2022-11-23 | Discharge: 2022-11-23 | Disposition: A | Discharge status: Still In Hospital | Source: Ambulatory Visit | Attending: FAMILY PRACTICE | Admitting: FAMILY PRACTICE

## 2022-11-23 ENCOUNTER — Other Ambulatory Visit: Payer: Self-pay

## 2022-11-23 NOTE — PT Treatment (Signed)
[facility]  Outpatient Physical Therapy  [address]  [place], [address]  [phone]  (Fax) [phone]    Physical Therapy Discharge Note    Date: 11/23/2022  Patient's Name: Samuel Tapia  Date of Birth: 03-11-1971        Visit #/POC: 11/16  Authorization:   POC Signed?: no  POC Ends: 12/12/21  Next Progress Note Due: by 10th visit or 10/17/22        Evaluating Physical Therapist: Rushie Goltz   PT diagnosis/Reason for Referral: B Trap Strain, R shoulder pain and B parasthesias  Next Scheduled Physician Appointment: Dr Sedalia Muta neurologist in March  Allergies/Contraindications: none        Subjective:Patient states that he fell asleep with his arm down to side and he woke up with pain 6-7/10. States that it took over a hour to ease up even with him bending and working his elbow. States hed has no pain in his neck. States that he only has pain in his (R) shoulder with certain movements.       Objective: Treatment delivered as noted below:        Measured ROM: Cervical AROM WNL all planes   Shoulder: WNL(B) he does have  pain with flexion at 45 degrees and IR at end range in (R) UE only  MMT (R) shoulder all planes 5 except flexion and IR 4-              (L) shoulder  all planes 5   EXERCISE/ACTIVITY NAME REPETITIONS RESISTANCE COMPLETED THIS DOS   Manual cervical distraction      manual No    MFR bilateral scalenes, suboccipitals, paraspinals, SCM      manual No   PROM rotation and capital extension       manual No   Supine cervical retraction with rotation    10x back and forth    n   Pec Minor stretch      manual n   Posterior capsule stretch  (Felt in AC joint)   No- deferred    Side lying R shoulder abduction + Scapular assistance  2x15 (therapist weans around 5-8) Manual + AROM No   Side lying R shoulder flexion + scapular assistance  2x15 (therapist weans around 5-8 reps) Manual + AROM No   Low trap setting at wall with foam roller  2x10   No    Thoracic mob PA     Grade II No   Stabilization ball on wall with wall pulleys x20   No   [place] + E-stim combination to R lateral elbow  X8 mins   n [place] only   Cross friction massage to extensor tendons     n   Eccentric wrist flexion/extension 2x10 Yellow N + HEP   Eccentric pronation/supination  2x10  Yellow N + HEP   Wrist flexor stretch 3x30   n+ HEP   Wrist extensor stretch 3x30   n+ HEP   KT tape for R lateral epicondylitis     No   Radial head mobilizations   G1-2 n   UBE f/b 5 min 60 rpm no   Serratus roll  x15 Green t band n   Shoulder distraction    manual N   Supination (R)    manual N   Tband ER @ 90/90 w/ shoulder press x15 YTB n   Foam roller work  - chest opener  - thoracic extension rolling  -  lat rolling bilat with active Release 10 min  Foam roller  n   ADL/ Patient ed  - discussed antagonist/protagonist muscles  - discussed gyroball, exercise peanut, and additional foam roller work.       n   HEP review   y   Reassessment of goals and objective findings   y     Patient-Specific Functional Score:                                                                                            1/18  Problem Score   1. Working out 3                         8   2. Carrying objects without dropping 0                         5     1.5                      7        Assessment: Patient has currently met 3 of 4 STGs and 2 LTGs. The remaining STGs were only partially met d/t pain. Patient reports numbness and paraesthesia down (B) hands has reduced to one time a week. Patient's main and consistent complaint is pain in lateral epicondyle. Reviewed HEP and patient he was able to verbalize all exercises and proper technique.         Short Term Goals: 4 Weeks               1. Pt to be independent and compliant with HEP and progressions MET 1/3               2. Pt to improve R shoulder AROM to 165 with improve scapular kinematics and no painful arch sign Partial MET 1/18               3. Pt to be improve functional IR of shoulder with no  increase of pain. Partial met 1/18               4. Pt to complete cervical flexion ROM without pain in thoracic spine MET 1/18               5. Pt to improve B pectoralis length in order to optimize UE positioning for occupational tasks. MET 1/18        Long Term Goals: 8 Weeks               1. Pt to improve PSFS to > 7/10 indicating significant improvement in function. MET 1/18               2. Pt to normalize scapulohumeral kinematics to decrease painful arch to allow pt to return to home gym restriction fre Progressing  1/18               3. To decrease frequency of dropping objects from hands to 1 time or less per week MET 1/18  4. Pt to abolish radicular/paraesthesia symptoms down B hands in order to improve QOL. Partial met 1/18        Plan: Discharge with independent HEP. Pt is having an MRI and if further indication that PT is needed a new referral will be needed. Pt in agreement with d/c at this time               Total Session Time 22 and Timed code minutes 22  THERAPEUTIC EXERCISE 22 minutes      Carlton Adam, PTA  11/23/2022, 07:59  Jonell Cluck, PT 11/23/2022 12:42

## 2022-11-28 ENCOUNTER — Ambulatory Visit (HOSPITAL_COMMUNITY): Payer: Self-pay

## 2022-11-30 ENCOUNTER — Ambulatory Visit (HOSPITAL_COMMUNITY): Payer: Self-pay

## 2022-12-14 IMAGING — DX XRAY FOREARM 2 VIEWS RT
1 series · 2 of 2 positions shown · non-contrast
Comparison: None available.

﻿EXAM:  [id number]      XRAY FOREARM 2 VIEWS RT
INDICATION: Right forearm pain.

[Series 1: AP · 0.14mm/px · 2 of 2 slices shown]
[im 1/2]
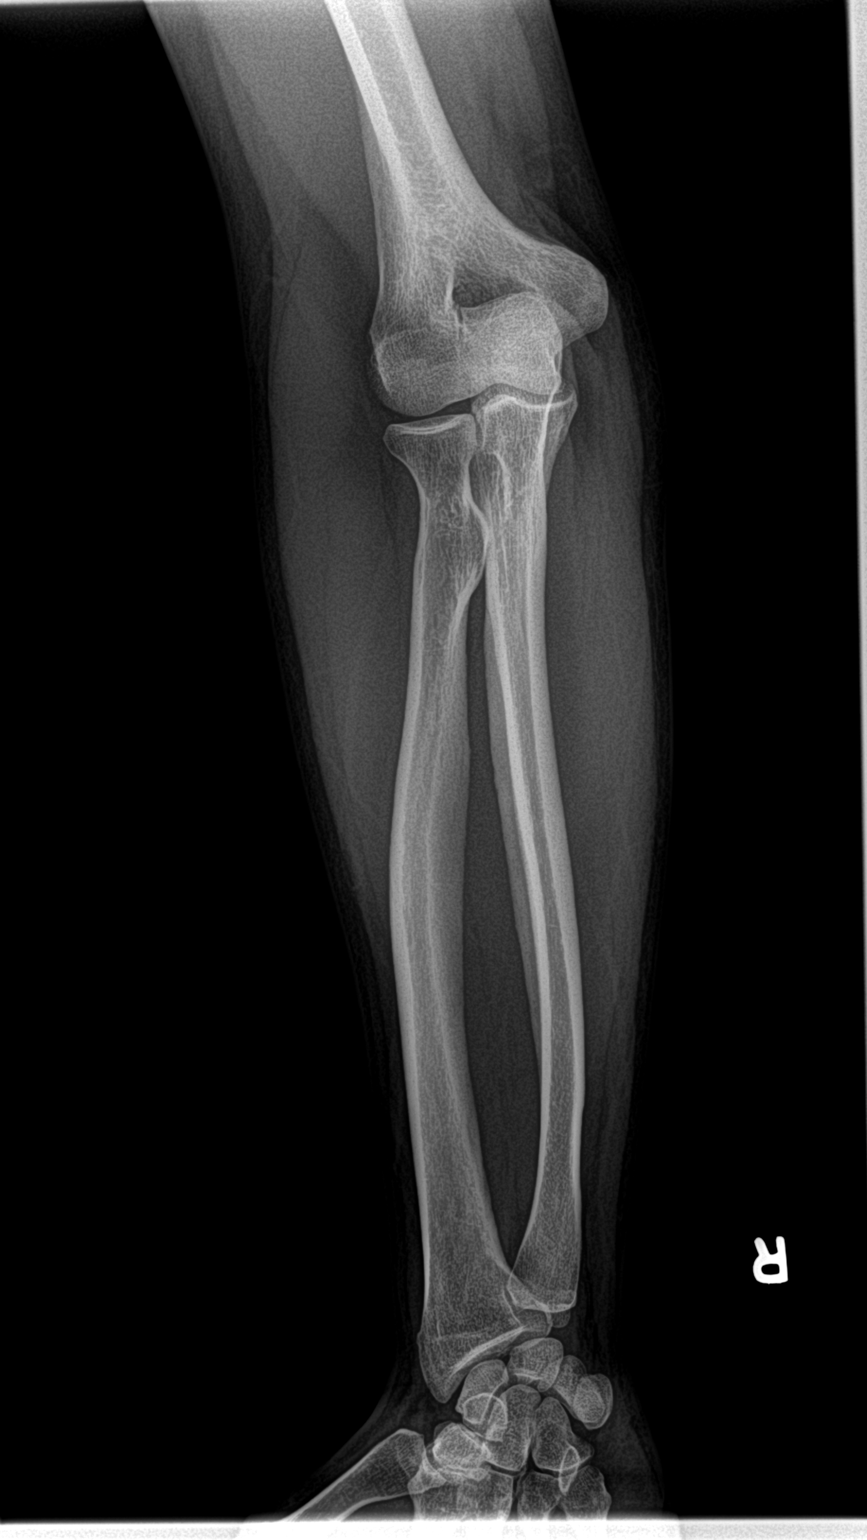
[im 2/2]
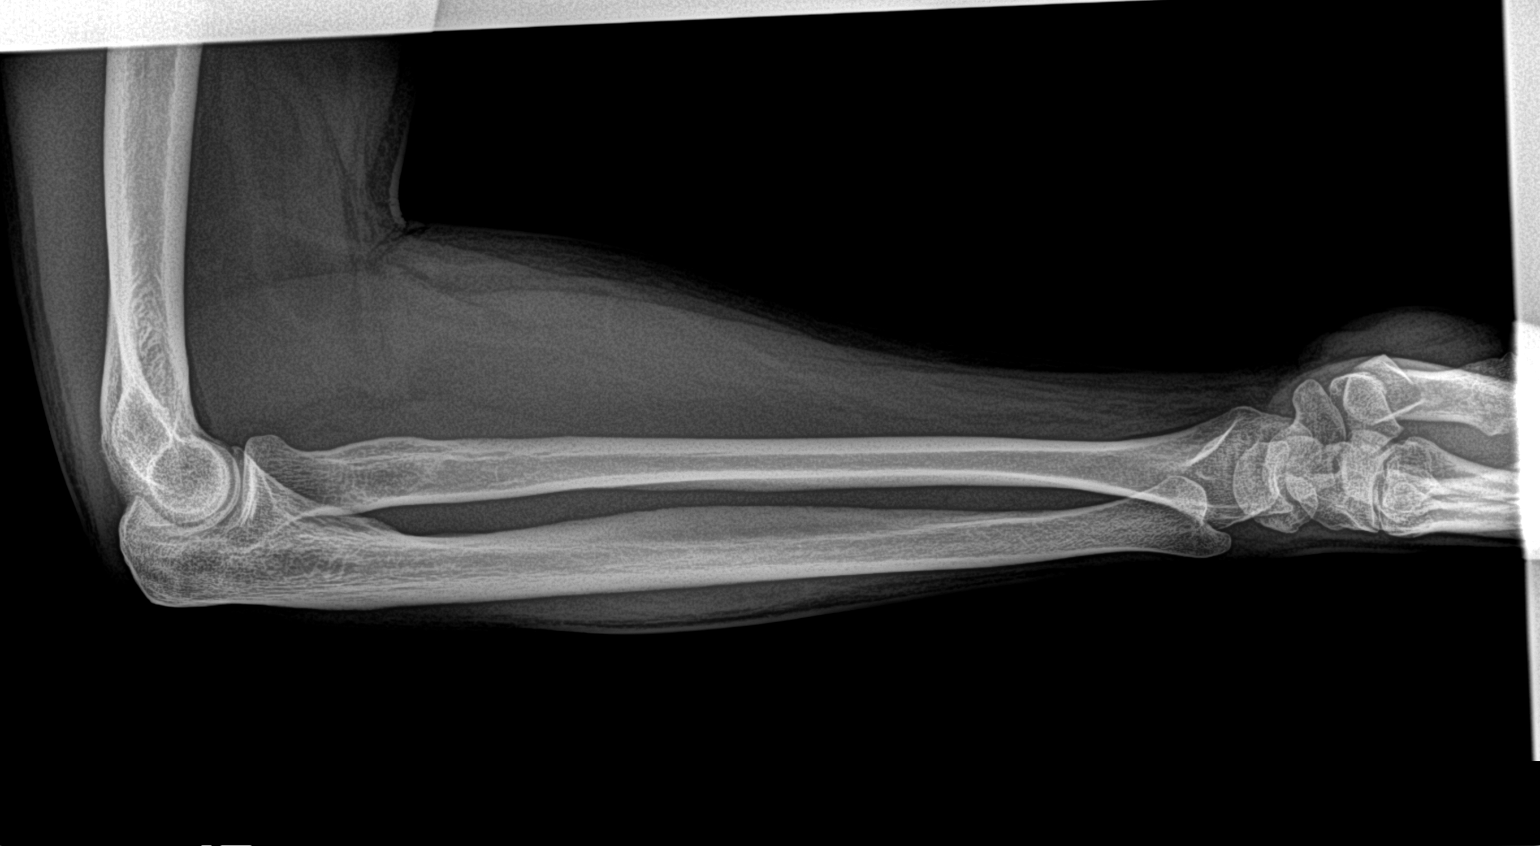

[2 of 2 positions shown; findings below may reference images not displayed]

FINDINGS: Two views demonstrate no fracture or lytic or blastic bony lesion.  There is no soft tissue calcification.
IMPRESSION: Normal examination.

## 2022-12-14 IMAGING — MR MRI SHOULDER RT W/O CONTRAST
7 series · 39 of 39 positions shown · non-contrast
Comparison: None available.

﻿EXAM:  [id number]   MRI SHOULDER RT W/O CONTRAST
INDICATION: Right shoulder pain, limited range of motion.
TECHNIQUE: Noncontrast multiplanar, multisequence MRI was performed.

[Series 4: axial shim · axial · right · 10.0mm · 3.12mm/px · 1 of 1 slices shown]
[im 1/1]
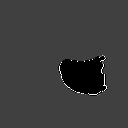

[Series 6: T1 · oblique · right · 3.5mm · 0.33mm/px · 6 of 18 slices shown]
[im 1/18]
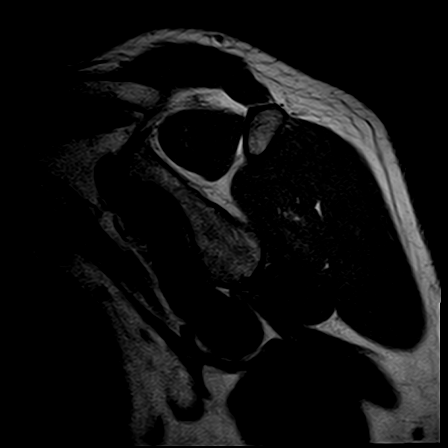
[im 4/18]
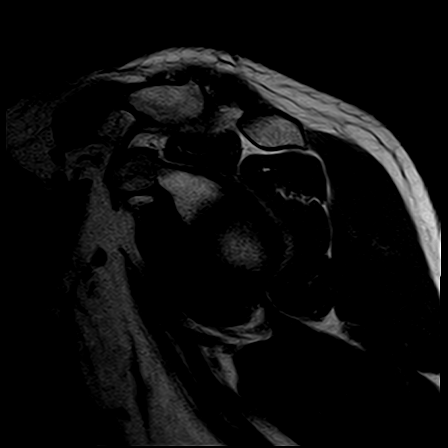
[im 7/18]
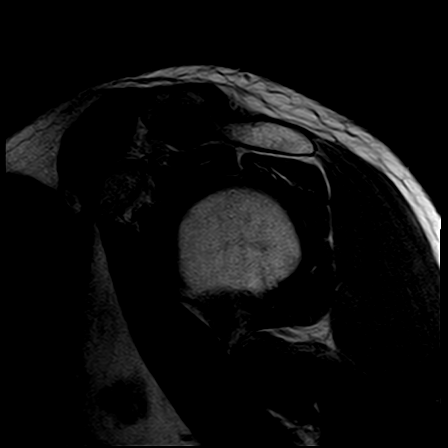
[im 11/18]
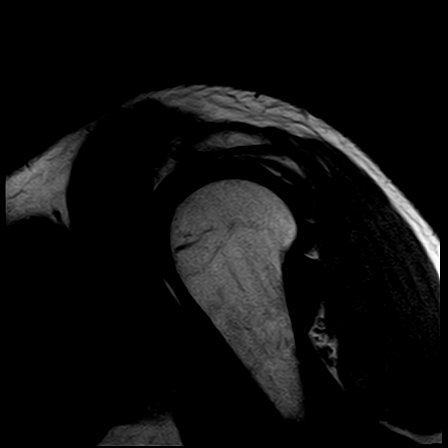
[im 14/18]
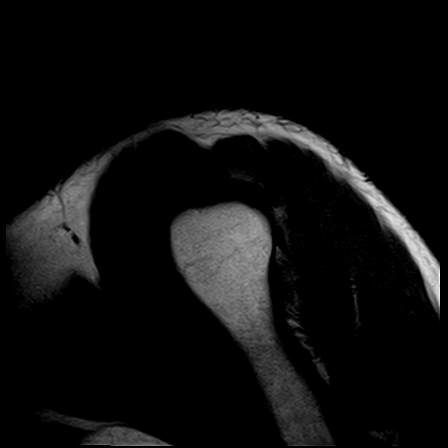
[im 18/18]
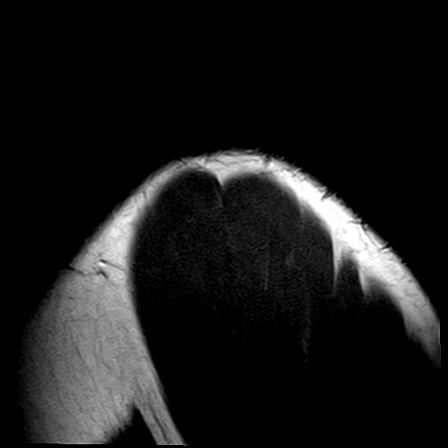

[Series 7: STIR · oblique · right · 3.5mm · 0.47mm/px · 6 of 18 slices shown (1 of 2)]
[im 1/18]
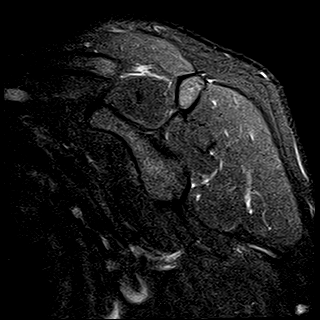
[im 4/18]
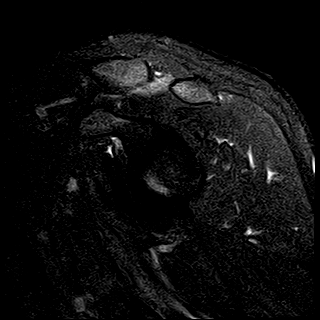
[im 7/18]
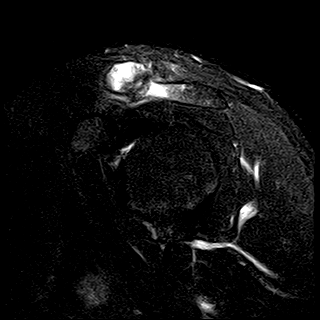
[im 11/18]
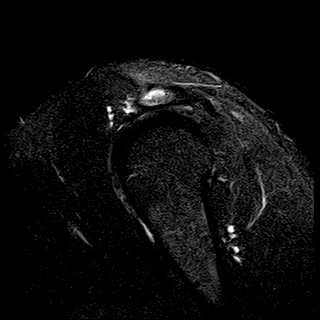
[im 14/18]
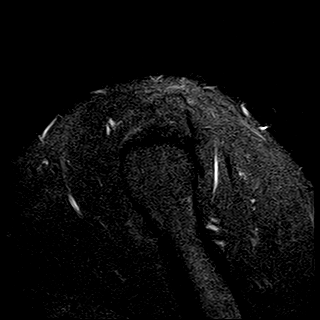
[im 18/18]
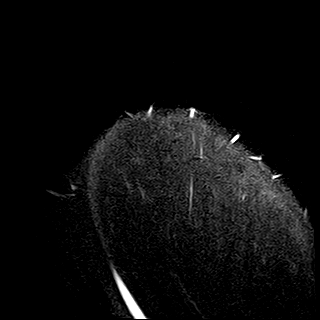

[Series 8: PD fat-sat · axial · right · 4.0mm · 0.50mm/px · z∈[-61,+15]mm · 6 of 18 slices shown (1 of 2)]
[im 1/18]
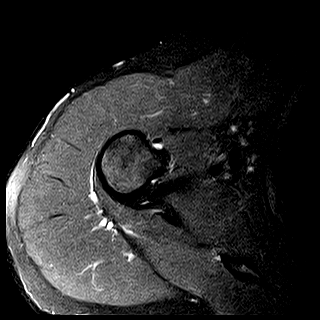
[im 4/18]
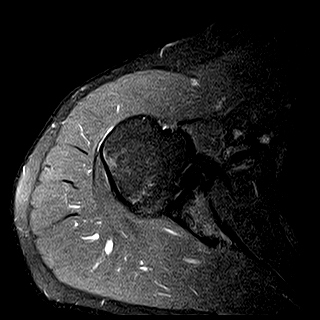
[im 7/18]
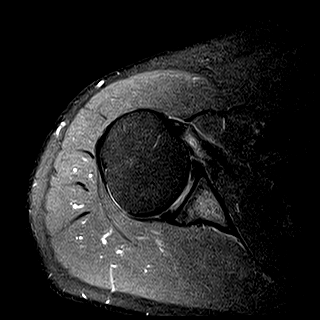
[im 11/18]
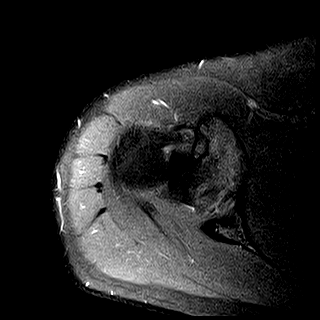
[im 14/18]
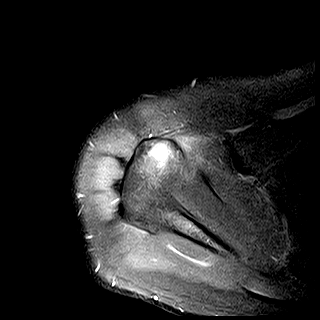
[im 18/18]
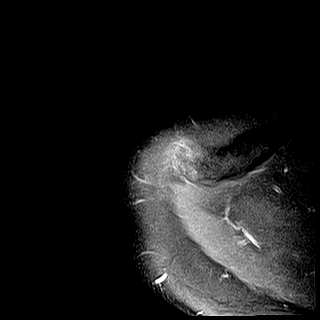

[Series 9: T2 fat-sat · axial · right · 4.0mm · 0.42mm/px · z∈[-74,+28]mm · 8 of 24 slices shown]
[im 1/24]
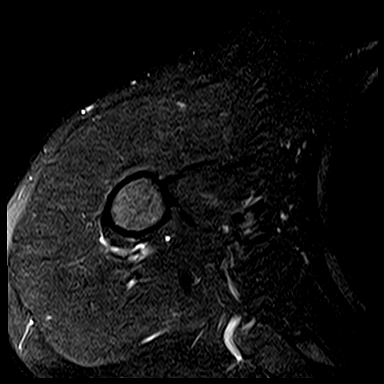
[im 4/24]
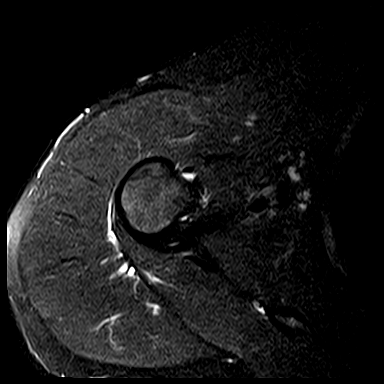
[im 7/24]
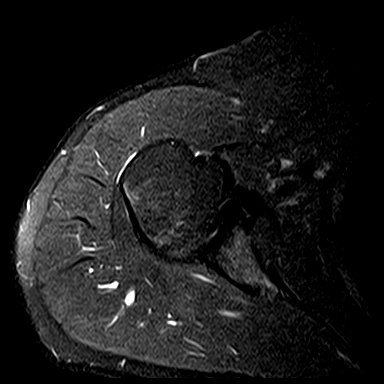
[im 10/24]
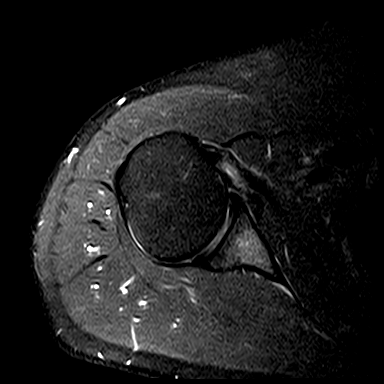
[im 14/24]
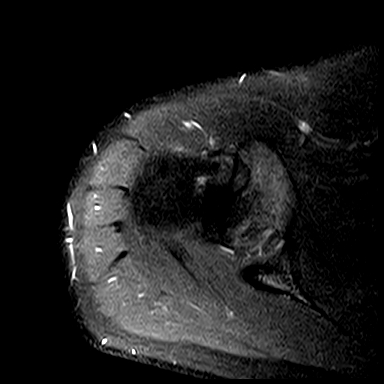
[im 17/24]
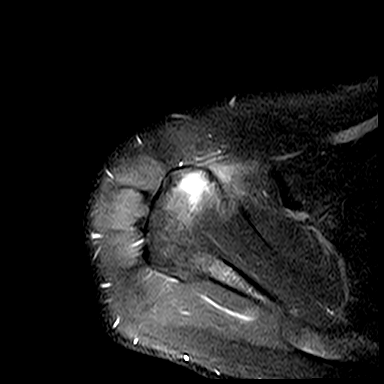
[im 20/24]
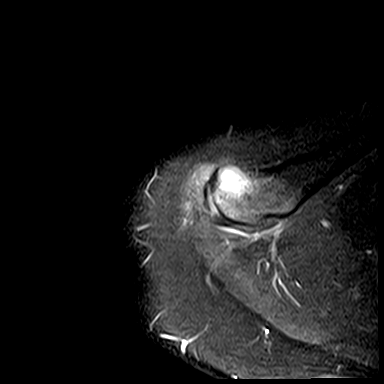
[im 24/24]
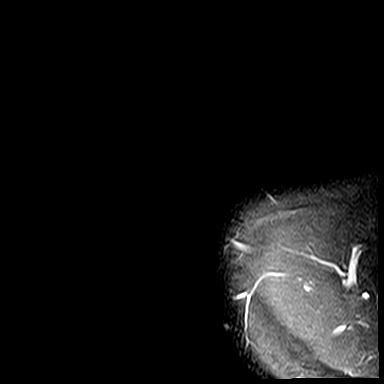

[Series 10: PD fat-sat · oblique · right · 3.5mm · 0.47mm/px · 6 of 18 slices shown (2 of 2)]
[im 1/18]
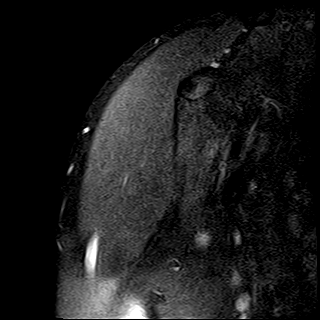
[im 4/18]
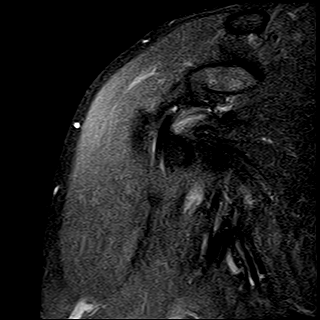
[im 7/18]
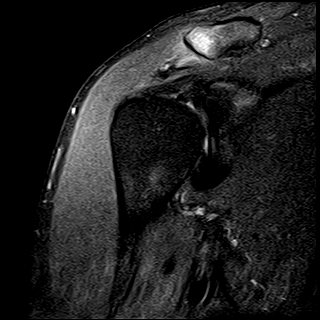
[im 11/18]
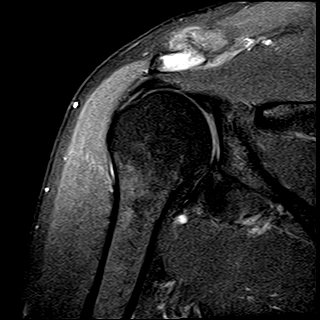
[im 14/18]
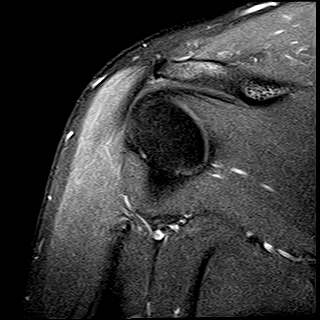
[im 18/18]
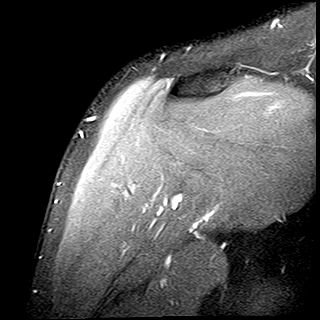

[Series 11: STIR · oblique · right · 3.5mm · 0.47mm/px · 6 of 18 slices shown (2 of 2)]
[im 1/18]
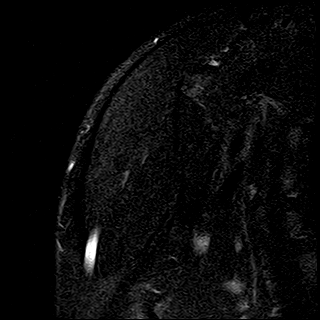
[im 4/18]
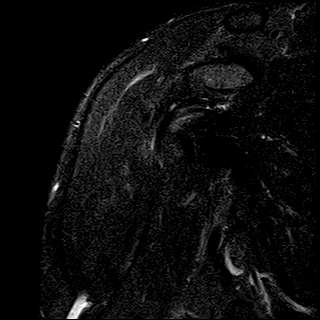
[im 7/18]
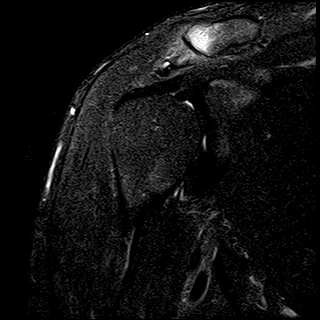
[im 11/18]
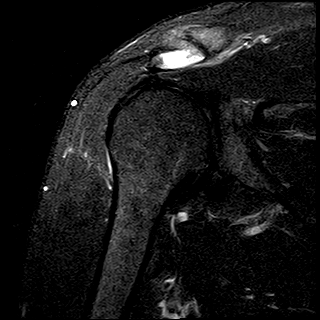
[im 14/18]
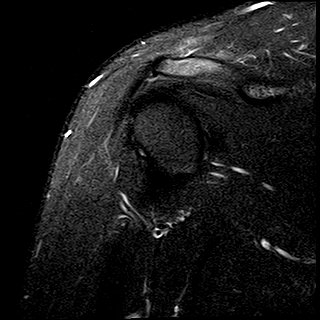
[im 18/18]
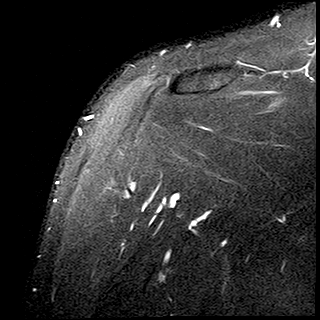

[39 of 39 positions shown; findings below may reference images not displayed]

FINDINGS: There is intermediate signal intensity seen within the supraspinatus tendon compatible with tendinosis.  No rotator cuff tear is seen. 

There are moderate degenerative changes involving the acromioclavicular joint with associated adjacent marrow edema.  There is down sloping of the acromion with a small acromial spur with moderate impingement on the supraspinatus muscle and tendon.  

The glenoid labrum appears to be intact.  There is no fracture or dislocation.  There is no abnormal fluid collection or significant joint effusion.
IMPRESSION: 1. Supraspinatus tendinosis.

2. Moderate impingement. 

3. Moderate acromioclavicular osteoarthritis with associated marrow edema.  

4. No rotator cuff tear is seen.

## 2023-01-10 ENCOUNTER — Encounter (INDEPENDENT_AMBULATORY_CARE_PROVIDER_SITE_OTHER): Payer: Self-pay | Admitting: NEUROLOGY

## 2024-03-20 ENCOUNTER — Other Ambulatory Visit: Payer: Self-pay

## 2024-03-20 ENCOUNTER — Ambulatory Visit
Admission: RE | Admit: 2024-03-20 | Discharge: 2024-03-20 | Disposition: A | Discharge status: Home / Self Care | Source: Ambulatory Visit | Attending: NURSE PRACTITIONER | Admitting: NURSE PRACTITIONER

## 2024-03-20 ENCOUNTER — Other Ambulatory Visit (HOSPITAL_COMMUNITY): Payer: Self-pay | Admitting: NURSE PRACTITIONER

## 2024-03-20 DIAGNOSIS — R55 Syncope and collapse: Secondary | ICD-10-CM | POA: Insufficient documentation
